# Patient Record
Sex: Male | Born: 1985 | Race: White | Hispanic: No | Marital: Married | Smoking: Never smoker
Health system: Southern US, Community
[De-identification: ages and names within clinical notes are randomized; demographics above are authoritative.]

## PROBLEM LIST (undated history)

## (undated) DIAGNOSIS — R112 Nausea with vomiting, unspecified: Secondary | ICD-10-CM

## (undated) DIAGNOSIS — R3129 Other microscopic hematuria: Secondary | ICD-10-CM

## (undated) DIAGNOSIS — T8859XA Other complications of anesthesia, initial encounter: Secondary | ICD-10-CM

## (undated) DIAGNOSIS — Z9889 Other specified postprocedural states: Secondary | ICD-10-CM

## (undated) DIAGNOSIS — T4145XA Adverse effect of unspecified anesthetic, initial encounter: Secondary | ICD-10-CM

## (undated) HISTORY — PX: WISDOM TOOTH EXTRACTION: SHX21

## (undated) HISTORY — PX: LEG SURGERY: SHX1003

---
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
## 2006-07-24 ENCOUNTER — Encounter: Admission: RE | Admit: 2006-07-24 | Discharge: 2006-07-24 | Payer: Self-pay | Admitting: Internal Medicine

## 2010-11-22 ENCOUNTER — Encounter: Payer: Self-pay | Admitting: Family Medicine

## 2010-11-22 ENCOUNTER — Ambulatory Visit
Admission: RE | Admit: 2010-11-22 | Discharge: 2010-11-22 | Payer: Self-pay | Source: Home / Self Care | Admitting: Family Medicine

## 2010-11-24 ENCOUNTER — Telehealth (INDEPENDENT_AMBULATORY_CARE_PROVIDER_SITE_OTHER): Payer: Self-pay | Admitting: *Deleted

## 2010-11-24 ENCOUNTER — Encounter (INDEPENDENT_AMBULATORY_CARE_PROVIDER_SITE_OTHER): Payer: Self-pay | Admitting: *Deleted

## 2010-12-07 NOTE — Progress Notes (Signed)
  Phone Note Outgoing Call Call back at [facility] Phone [phone]   Call placed by: Lajean Saver RN,  November 24, 2010 3:20 PM Call placed to: Patient Summary of Call: Callback. No answer. Message left with reason for call and call back with any questions or concerns

## 2010-12-07 NOTE — Letter (Signed)
Summary: Out of Work  [facility]  [address]   [place], [place] [address]   Phone: [phone]  Fax: [phone]    November 22, 2010   Employee:  Roger Marquez    To Whom It May Concern:   For Medical reasons, please excuse the above named employee from work from 11/21/10 through 11/23/10.    If you need additional information, please feel free to contact our office.         Sincerely,    Donna Christen MD

## 2010-12-07 NOTE — Assessment & Plan Note (Signed)
Summary: HEADACHE/FEVER/DIARRHEA/BODY ACHES (rm3)   Vital Signs:  Patient Profile:   25 Years Old Male CC:      N/V/D x 1 day ago, HA, dizziness Height:     67 inches Weight:      152 pounds O2 Sat:      97 % O2 treatment:    Room Air Temp:     98.4 degrees F oral Pulse rate:   87 / minute Resp:     14 per minute BP sitting:   129 / 87  (left arm) Cuff size:   regular  Vitals Entered By: Lajean Saver RN (November 22, 2010 2:38 PM)                  Updated Prior Medication List: TYLENOL 325 MG TABS (ACETAMINOPHEN) prn IMODIUM A-D 1 MG/7.5ML LIQD (LOPERAMIDE HCL)   Current Allergies: No known allergies History of Present Illness Chief Complaint: N/V/D x 1 day ago, HA, dizziness History of Present Illness:  Subjective:  Patient complains of onset of nausea, vomiting, and diarrhea two days ago at 8PM.  He has also had headache, myalgias, fatigue, and low grade fever.  The diarrhea has ceased with clear liquids but he still feels dizzy.  He had a fever this morning to 100.6.  No abdominal pain.  No resp or GU symptoms.  No recent foreign travel or drinking untreated water.  He states that he is now able to take fluids without vomiting.  REVIEW OF SYSTEMS Constitutional Symptoms      Denies fever, chills, night sweats, weight loss, weight gain, and fatigue.  Eyes       Denies change in vision, eye pain, eye discharge, glasses, contact lenses, and eye surgery. Ear/Nose/Throat/Mouth       Complains of dizziness.      Denies hearing loss/aids, change in hearing, ear pain, ear discharge, frequent runny nose, frequent nose bleeds, sinus problems, sore throat, hoarseness, and tooth pain or bleeding.  Respiratory       Denies dry cough, productive cough, wheezing, shortness of breath, asthma, bronchitis, and emphysema/COPD.  Cardiovascular       Denies murmurs, chest pain, and tires easily with exhertion.    Gastrointestinal       Complains of stomach pain, nausea/vomiting, and  diarrhea.      Denies constipation, blood in bowel movements, and indigestion. Genitourniary       Denies painful urination, kidney stones, and loss of urinary control. Neurological       Complains of headaches.      Denies paralysis, seizures, and fainting/blackouts. Musculoskeletal       Denies muscle pain, joint pain, joint stiffness, decreased range of motion, redness, swelling, muscle weakness, and gout.  Skin       Denies bruising, unusual mles/lumps or sores, and hair/skin or nail changes.  Psych       Denies mood changes, temper/anger issues, anxiety/stress, speech problems, depression, and sleep problems. Other Comments: Patient c/o N/V/D one day ago lasting 24 hours. He is able to eat very little currently. He c/o weakness, HA, nausea and dizziness. He denies eating or doing anything out of his usual. No injury   Past History:  Past Medical History: Unremarkable  Past Surgical History: right lower leg Sx  Family History: Never  Social History: Occupation: [employer] Never Smoked Alcohol use-no Drug use-no Engaged 4 step childrenSmoking Status:  never Drug Use:  no   Objective:  Appearance:  Patient appears healthy, stated age, and  in no acute distress  Eyes:  Pupils are equal, round, and reactive to light and accomdation.  Extraocular movement is intact.  Conjunctivae are not inflamed.  Ears:  cerumin impactions Nose:  No congestion or sinus tenderness Mouth:  moist mucous membranes  Pharynx:  Normal  Neck:  Supple.  No adenopathy is present.  No thyromegaly is present  Lungs:  Clear to auscultation.  Breath sounds are equal.  Heart:  Regular rate and rhythm without murmurs, rubs, or gallops.  Abdomen:  Nontender without masses or hepatosplenomegaly.  Bowel sounds somewhat increased.  No CVA or flank tenderness.  Iliopsoas and obdurator tests negative Extremities:  No edema.   Skin:  No rash Assessment New Problems: DIARRHEA (ICD-787.91) NAUSEA WITH VOMITING  (ICD-787.01)  SUSPECT VIRAL GASTROENTERITIS, IMPROVING.  Plan New Orders: New Patient Level III [99203] Planning Comments:    Wife states that she has Compazine at home; may continue this as needed.  Continue clear liquids and gradually advance diet. Follow-up with PCP if not improving.   The patient and/or caregiver has been counseled thoroughly with regard to medications prescribed including dosage, schedule, interactions, rationale for use, and possible side effects and they verbalize understanding.  Diagnoses and expected course of recovery discussed and will return if not improved as expected or if the condition worsens. Patient and/or caregiver verbalized understanding.   Orders Added: 1)  New Patient Level III [40347]

## 2010-12-07 NOTE — Letter (Signed)
Summary: Out of Work  [facility]  [address]   [place], [place] [address]   Phone: [phone]  Fax: [phone]    November 24, 2010   Employee:  AWAIS COBARRUBIAS    To Whom It May Concern:   For Medical reasons, please excuse the above named employee from work for the following dates:  November 24, 2010  If you need additional information, please feel free to contact our office.         Sincerely,    Lajean Saver RN  Appended Document: Out of Work Patient is still having some nausea and dizziness. 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

## 2011-01-30 ENCOUNTER — Encounter: Payer: Self-pay | Admitting: Family Medicine

## 2011-01-30 ENCOUNTER — Inpatient Hospital Stay (INDEPENDENT_AMBULATORY_CARE_PROVIDER_SITE_OTHER)
Admission: RE | Admit: 2011-01-30 | Discharge: 2011-01-30 | Disposition: A | Discharge status: Home / Self Care | Payer: Self-pay | Source: Ambulatory Visit | Attending: Family Medicine | Admitting: Family Medicine

## 2011-01-30 DIAGNOSIS — K4090 Unilateral inguinal hernia, without obstruction or gangrene, not specified as recurrent: Secondary | ICD-10-CM | POA: Insufficient documentation

## 2011-01-30 DIAGNOSIS — R5383 Other fatigue: Secondary | ICD-10-CM

## 2011-01-31 ENCOUNTER — Telehealth (INDEPENDENT_AMBULATORY_CARE_PROVIDER_SITE_OTHER): Payer: Self-pay | Admitting: *Deleted

## 2011-02-06 NOTE — Letter (Signed)
Summary: Internal Correspondence  Internal Correspondence   Imported By: Dannette Barbara 01/30/2011 11:30:07  _____________________________________________________________________  External Attachment:    Type:   Image     Comment:   External Document

## 2011-02-06 NOTE — Letter (Signed)
Summary: Out of Work  [facility]  [address]   [place], [place] [address]   Phone: [phone]  Fax: [phone]    January 30, 2011   Employee:  AMED DATTA    To Whom It May Concern:   Seung has a right inguinal hernia and should avoid heavy lifting, pushing, pulling, and straining.    If you need additional information, please feel free to contact our office.         Sincerely,    Donna Christen MD

## 2011-02-06 NOTE — Assessment & Plan Note (Signed)
Summary: lymph nodes swelling? rm 5   Vital Signs:  Patient Profile:   25 Years Old Male CC:      lymph nodes swollen Height:     67 inches Weight:      163.75 pounds O2 Sat:      98 % O2 treatment:    Room Air Temp:     98.4 degrees F oral Pulse rate:   70 / minute Resp:     16 per minute BP sitting:   132 / 77  (left arm) Cuff size:   regular  Vitals Entered By: Clemens Catholic LPN (January 30, 2011 11:14 AM)                  Updated Prior Medication List: No Medications Current Allergies (reviewed today): No known allergies History of Present Illness Chief Complaint: lymph nodes swollen History of Present Illness:  Subjective:  Patient presents with his wife.  They report that he developed URI symptoms about 3 to 4 weeks ago that started with fatigue and fever for two days, associated with a sore throat for about a week.  He developed a cough that has lasted about 3 weeks, now finally resolving.  He states that his energy is now almost back to normal.  His wife has noted that he has had swollen lymph nodes in his neck, and she believes swollen nodes in right inguinal area also.  No GI or GU symptoms.  No abdominal pain.  Good appetite.  No rash. He also complains that his ears feel clogged without pain  REVIEW OF SYSTEMS Constitutional Symptoms      Denies fever, chills, night sweats, weight loss, weight gain, and fatigue.  Eyes       Denies change in vision, eye pain, eye discharge, glasses, contact lenses, and eye surgery. Ear/Nose/Throat/Mouth       Denies hearing loss/aids, change in hearing, ear pain, ear discharge, dizziness, frequent runny nose, frequent nose bleeds, sinus problems, sore throat, hoarseness, and tooth pain or bleeding.  Respiratory       Denies dry cough, productive cough, wheezing, shortness of breath, asthma, bronchitis, and emphysema/COPD.  Cardiovascular       Denies murmurs, chest pain, and tires easily with exhertion.    Gastrointestinal       Denies stomach pain, nausea/vomiting, diarrhea, constipation, blood in bowel movements, and indigestion. Genitourniary       Denies painful urination, kidney stones, and loss of urinary control. Neurological       Denies paralysis, seizures, and fainting/blackouts. Musculoskeletal       Denies muscle pain, joint pain, joint stiffness, decreased range of motion, redness, swelling, muscle weakness, and gout.  Skin       Denies bruising, unusual mles/lumps or sores, and hair/skin or nail changes.  Psych       Denies mood changes, temper/anger issues, anxiety/stress, speech problems, depression, and sleep problems. Other Comments: pt c/o his lymph nodes in his neck being swollen x sunday. he also states that his lymph nodes in his RT groin area being very swollen x 2wks. no fever. he has had a chronic cough. he took 2 Advil Sunday.   Past History:  Past Medical History: Reviewed history from 11/22/2010 and no changes required. Unremarkable  Past Surgical History: Reviewed history from 11/22/2010 and no changes required. right lower leg Sx  Family History: father-  Family History Diabetes 1st degree relative Family History Hypertension mom-healthy  Social History: Reviewed history from 11/22/2010 and  no changes required. Occupation: [employer] Never Smoked Alcohol use-no Drug use-no Engaged 4 step children   Appearance:  Patient appears healthy, stated age, and in no acute distress  Eyes:  Pupils are equal, round, and reactive to light and accomdation.  Extraocular movement is intact.  Conjunctivae are not inflamed.  Ears:  Canals occluded with cerumen.  Post lavage, canals and tympanic membranes appear normal Nose:  Not congested, no sinus tenderness Pharynx:  Normal  Neck:  Supple.  Slightly tender shotty anterior/posterior nodes are palpated bilaterally.  No thyromegaly Lungs:  Clear to auscultation.  Breath sounds are equal.  Heart:  Regular rate and rhythm without  murmurs, rubs, or gallops.  Abdomen:  Mild tenderness over spleen without masses or hepatosplenomegaly.  Bowel sounds are present.  No CVA or flank tenderness.  GU:  Normal penis/testes/scrotum.  No adenopathy.  Right inguinal hernia is present with valsalva. Extremities:  No edema.  No epitrochlear nodes palpated Skin:  No rash. CBC:  WBC 7.1; Normal diff; Hgb 15.0  Assessment New Problems: CERUMEN IMPACTION, BILATERAL (ICD-380.4) INGUINAL HERNIA, RIGHT (ICD-550.90) FATIGUE (ICD-780.79) FAMILY HISTORY DIABETES 1ST DEGREE RELATIVE (ICD-V18.0)  SUSPECT FATIGUE A RESULT OF RESOLVING MONO.  NORMAL CBC TODAY. RIGHT INGUINAL HERNIA  Plan New Orders: Surgical Referral [Surgery] CBC w/Diff [[id number]] Est. Patient Level II [09811] Planning Comments:   Reassurance.  Follow-up with PCP if develops increased lymph node swelling. Call if any ear discomfort or discharge develops after ear lavage (will call in Rx for Cortisporin Otic susp) Refer to general surgeon for hernia repair.  Given a Mickel Crow patient information and instruction sheet on topic inguinal hernia.   The patient and/or caregiver has been counseled thoroughly with regard to medications prescribed including dosage, schedule, interactions, rationale for use, and possible side effects and they verbalize understanding.  Diagnoses and expected course of recovery discussed and will return if not improved as expected or if the condition worsens. Patient and/or caregiver verbalized understanding.   Orders Added: 1)  Surgical Referral [Surgery] 2)  CBC w/Diff [[id number]] 3)  Est. Patient Level II [13086]

## 2011-02-06 NOTE — Progress Notes (Signed)
  Phone Note Outgoing Call   Call placed by: Clemens Catholic LPN,  January 31, 2011 8:51 AM Call placed to: Patient Summary of Call: appt sch for pt with [facility] in the [facility] office for 02/06/11 @ 1:30pm. pt aware of financial agreement with them and to arrive at 1:00pm to do paperwork. Initial call taken by: Clemens Catholic LPN,  January 31, 2011 8:53 AM

## 2011-03-23 ENCOUNTER — Encounter (HOSPITAL_BASED_OUTPATIENT_CLINIC_OR_DEPARTMENT_OTHER)
Admission: RE | Admit: 2011-03-23 | Discharge: 2011-03-23 | Disposition: A | Discharge status: Home / Self Care | Payer: Worker's Compensation | Source: Ambulatory Visit | Attending: Surgery | Admitting: Surgery

## 2011-03-23 LAB — COMPREHENSIVE METABOLIC PANEL
ALT: 102 U/L — ABNORMAL HIGH (ref 0–53)
AST: 43 U/L — ABNORMAL HIGH (ref 0–37)
Albumin: 4 g/dL (ref 3.5–5.2)
Calcium: 9.5 mg/dL (ref 8.4–10.5)
Creatinine, Ser: 0.93 mg/dL (ref 0.4–1.5)
Sodium: 139 mEq/L (ref 135–145)
Total Protein: 7.3 g/dL (ref 6.0–8.3)

## 2011-03-23 LAB — CBC
MCH: 30 pg (ref 26.0–34.0)
MCHC: 36.2 g/dL — ABNORMAL HIGH (ref 30.0–36.0)
Platelets: 231 10*3/uL (ref 150–400)
RBC: 5.26 MIL/uL (ref 4.22–5.81)
RDW: 13.1 % (ref 11.5–15.5)

## 2011-03-23 LAB — DIFFERENTIAL
Basophils Relative: 0 % (ref 0–1)
Eosinophils Absolute: 0.2 10*3/uL (ref 0.0–0.7)
Eosinophils Relative: 3 % (ref 0–5)
Monocytes Absolute: 0.6 10*3/uL (ref 0.1–1.0)
Monocytes Relative: 9 % (ref 3–12)
Neutrophils Relative %: 52 % (ref 43–77)

## 2011-03-23 NOTE — Op Note (Signed)
NAME:  Roger Marquez, Roger Marquez                         ACCOUNT NO.:  [id number]   MEDICAL RECORD NO.:  [id number]                   PATIENT TYPE:  AMB   LOCATION:  DSC                                  FACILITY:  [facility]   PHYSICIAN:  Harvie Junior, M.D.                DATE OF BIRTH:  [birth year]   DATE OF PROCEDURE:  04/17/2004  DATE OF DISCHARGE:                                 OPERATIVE REPORT   PREOPERATIVE DIAGNOSIS:  Fascial hernia, left lower extremity.   POSTOPERATIVE DIAGNOSIS:  Fascial hernia, left lower extremity.   PROCEDURE:  Anterior and lateral compartment fasciotomy, left lower  extremity.   SURGEON:  Harvie Junior, M.D.   ASSISTANT:  Marshia Ly, P.A.   ANESTHESIA:  General.   BRIEF HISTORY:  The patient is an 25 year old male status post having had a  left lower extremity fascial release for fascial hernia.  He has done  wonderfully.  He began having similar symptoms of his left leg with  activities and because of continued complaints of pain, it was ultimately  felt that he wanted to have this similar procedure done.  He is brought to  the operating room for this procedure.   DESCRIPTION OF PROCEDURE:  The patient was brought to the operating room and  after adequate anesthesia was obtained with a general anesthetic, the  patient was placed on the operating table.  The left leg was prepped and  draped in the usual sterile fashion.  Following this, the leg was  exsanguinated and tourniquet inflated to 350 mmHg.  A small incision was  made over the area of the fascial defect and the fascia was identified from  the tibia all the way back to the fibula.  The fascia was opened and the  anterior compartment dissected superiorly to the level of the knee and  distally to the level of the ankle.  Prior to doing this, the attempt to  finding the defect at both levels identified was unsuccessful and I think  this was a weakness in the fascia and not a true fascial defect.  At  this  point, attention was turned back to the lateral compartment where a lateral  release of the lateral compartment was done proximally and distally in a  similar fashion.  At this point, the wound was copiously irrigated and  suctioned dry.  The skin was closed with a combination of 2-0 Vicryl and a 3-  0 Monocryl pull out suture.  Benzoin and Steri-Strips were applied.  A  sterile compression dressing was applied.  The patient was taken to the  recovery room where he was noted to be in satisfactory condition.  Estimated  blood loss for the procedure was none.  Harvie Junior, M.D.    Ranae Plumber  D:  04/17/2004  T:  04/17/2004  Job:  [id number]

## 2011-03-23 NOTE — Op Note (Signed)
   NAME:  Roger Marquez, Roger Marquez                         ACCOUNT NO.:  [id number]   MEDICAL RECORD NO.:  [id number]                   PATIENT TYPE:  AMB   LOCATION:  DSC                                  FACILITY:  [facility]   PHYSICIAN:  Harvie Junior, M.D.                DATE OF BIRTH:  Feb 02, 1986   DATE OF PROCEDURE:  02/15/2003  DATE OF DISCHARGE:  02/15/2003                                 OPERATIVE REPORT   PREOPERATIVE DIAGNOSIS:  Painful fascial hernia, right lower leg.   POSTOPERATIVE DIAGNOSIS:  Painful fascial hernia, right lower leg.   PROCEDURE:  Fasciotomy right anterior compartment of the right leg and  lateral compartment of the right leg.   SURGEON:  Harvie Junior, M.D.   ASSISTANT:  Marshia Ly, P.A.   ANESTHESIA:  General.   BRIEF HISTORY:  The patient is a 25 year old male with a long history of  having a small fascial hernia of the right leg.  He ultimately would have  pain with exertion.  Because of continued complaints of pain and palpation  of the fascial hernia, the patient was taken to the operating room for  opening of the fascial hernia.   DESCRIPTION OF PROCEDURE:  The patient was taken to the operating room and  after undergoing general anesthesia, the patient was placed on the operating  table, the right leg was prepped and draped in the usual sterile fashion.  Following this, a small incision was made over the right hernia.  Three  small areas of fascial herniation were identified and connected and  following this, the anterior and lateral fascial compartments were opened  with long Metzenbaum scissors throughout the length of the lower extremity  with care being taken to isolate the fascia from both above the skin and  below from the muscle tissue.  Once the area had been opened, the wound was  copiously irrigated and suctioned dry.  The wound was then closed with 4-0  nylon interrupted sutures.  A sterile, compressive dressing was applied.  The  patient was taken to the recovery room and was noted to be in  satisfactory condition.                                               Harvie Junior, M.D.    Ranae Plumber  D:  06/02/2003  T:  06/02/2003  Job:  [id number]

## 2011-03-27 ENCOUNTER — Ambulatory Visit (HOSPITAL_BASED_OUTPATIENT_CLINIC_OR_DEPARTMENT_OTHER)
Admission: RE | Admit: 2011-03-27 | Discharge: 2011-03-27 | Disposition: A | Discharge status: Home / Self Care | Payer: Worker's Compensation | Source: Ambulatory Visit | Attending: Surgery | Admitting: Surgery

## 2011-03-27 DIAGNOSIS — Z01812 Encounter for preprocedural laboratory examination: Secondary | ICD-10-CM | POA: Insufficient documentation

## 2011-03-27 DIAGNOSIS — K4090 Unilateral inguinal hernia, without obstruction or gangrene, not specified as recurrent: Secondary | ICD-10-CM | POA: Insufficient documentation

## 2011-03-27 HISTORY — PX: HERNIA REPAIR: SHX51

## 2011-04-04 NOTE — Op Note (Signed)
NAMEALDRIDGE, KRZYZANOWSKI               ACCOUNT NO.:  [id number]  MEDICAL RECORD NO.:  [id number]           PATIENT TYPE:  LOCATION:                                 FACILITY:  PHYSICIAN:  Clovis Pu. Bernetha Anschutz, M.D.     DATE OF BIRTH:  DATE OF PROCEDURE:  03/27/2011 DATE OF DISCHARGE:                              OPERATIVE REPORT   PREOPERATIVE DIAGNOSIS:  Right inguinal hernia.  POSTOPERATIVE DIAGNOSIS:  Direct right inguinal hernia  PROCEDURE:  Repair of right hernia with Ultrapro hernia system mesh.  SURGEON:  Maisie Fus A. Mcgwire Dasaro, MD  ANESTHESIA:  General LMA with 0.25% Sensorcaine local with epinephrine 20 mL used.  ESTIMATED BLOOD LOSS:  Minimal.  SPECIMENS:  None.  INDICATIONS FOR PROCEDURE:  The patient is a 25 year old male with a bulging painful right inguinal hernia.  He was evaluated and recommendations were made.  Open laparoscopic repairs were discussed as well as potential observation.  He also had a skin lesion in his right groin that want me to be removed as well.  It appeared to be an actinic keratoses but that was not approved by insurance.  Therefore today we decided not to do that.  I discussed this with the patient and his family preoperatively the fact that skin lesion was not going to be removed and they were in agreement not to do so.  We discussed the procedure of open inguinal hernia repair with mesh, the risk of bleeding, infection, hernia recurrence, numbness, discomfort, chronic pain issues after hernia surgery, mesh infection, injury to cord structures, injury to major vascular structures, injury to bladder, ureter, small bowel and large bowel.  Also discussed numbness around the incision, also in the inner aspect of the thigh and scrotum.  We discussed preoperatively a laparoscopic repair.  After discussion of all the above and also observation and potential treatment, we agreed to preserve it.  He wished to do an open repair.  DESCRIPTION OF  PROCEDURE:  The patient was brought to the operating room after being seen in the holding area with a right-sided marked.  After induction of LMA anesthesia, time-out was done after prep and drape of the right inguinal region.  Sensorcaine 0.25% was infiltrated in the right inguinal crease.  Dissection was carried down through the subcutaneous fat tissue.  The superficial epigastric vessels were ligated with 3-0 Vicryl.  The aponeurosis of the external oblique was visualized, injected with 0.25% Sensorcaine with epinephrine.  Fibers were opened in the direction of another fibers to the external ring. Cord structures were encircled with 0.25 inch Penrose drain.  He had a very large direct hernia that I dissected off the cord structures. There was no evidence of indirect hernia.  Ilioinguinal nerve coursed with the cord structures.  I then used a large Utrapro Prolene hernia system and I was able to place inner leaflet in the preperitoneal space. Once we did this, I then cut a slit of the cord structures and closed the mesh around the cord structures with 2-0 Novafil.  A #1-0 Vicryl was used to tack the onlay down to the pubic tubercle and [employer]  ligament region and a second 0 Vicryl suture was used to connect the connection between the onlay and the underlay with the internal oblique.  The onlay was secured, shelving edge of the inguinal ligament and the internal oblique with #1 Novafil pop-off circumferentially.  This left ample room for the cord structures to exit through the mesh with enough space for the tip of my fifth digit to be inserted into it.  At this point in time, I preserved the ilioinguinal nerve which ran along the cord structure itself.  Iliohypogastric nerve was well out of the operative field.  The general femoral nerve was preserved as well.  We then at this point in time closed the aponeurosis of external oblique with 2-0 Vicryl.  A 3- 0 Vicryl was used to approximate  the Scarpa fascia and 4-0 Monocryl was used to close the skin in a subcuticular fashion.  Dermabond was applied.  All final counts of sponge, needle and instrument counts were found to be correct at this portion of the case.  The patient was awakened, extubated, and taken to recovery in satisfactory condition.     Alaze Garverick A. Holden Maniscalco, M.D.     TAC/MEDQ  D:  03/27/2011  T:  03/27/2011  Job:  [id number]  cc:   Reola Calkins  Electronically Signed by Harriette Bouillon M.D. on 04/04/2011 07:32:39 AM

## 2011-05-07 ENCOUNTER — Telehealth (INDEPENDENT_AMBULATORY_CARE_PROVIDER_SITE_OTHER): Payer: Self-pay | Admitting: General Surgery

## 2011-06-14 ENCOUNTER — Encounter (INDEPENDENT_AMBULATORY_CARE_PROVIDER_SITE_OTHER): Payer: Self-pay | Admitting: Surgery

## 2011-06-14 ENCOUNTER — Ambulatory Visit (INDEPENDENT_AMBULATORY_CARE_PROVIDER_SITE_OTHER): Payer: Worker's Compensation | Admitting: Surgery

## 2011-06-14 DIAGNOSIS — G8918 Other acute postprocedural pain: Secondary | ICD-10-CM

## 2011-06-14 MED ORDER — TRAMADOL HCL 50 MG PO TABS: 50.0000 mg | ORAL_TABLET | Freq: Four times a day (QID) | ORAL | Status: DC | PRN

## 2011-06-14 MED ORDER — OXYCODONE-ACETAMINOPHEN 5-325 MG PO TABS: 1.0000 | ORAL_TABLET | Freq: Four times a day (QID) | ORAL | Status: DC | PRN

## 2011-06-14 NOTE — Progress Notes (Signed)
The patient returns to clinic today. He is about 10 weeks out from repair of right inguinal hernia with ultrapro mesh.  He was doing a lot of squatting over the weekend and developed right groin pain at his operative site. The pain as throbbing in nature and made worse with squatting. The area now is sore to palpation. There is no new bulge.  Physical examination: Right inguinal incision well healed. No evidence of recurrent hernia. There is no redness. Mild tenderness to palpation of the scar.  Impression right groin pain after inguinal hernia repair  Plan: I will place him on old TRAM milligrams p.o. every 6 hours as needed for pain and Roxicet 1-2 to Q6 hours p.r.n. pain. I've asked him to refrain from heavy lifting squatting until I see him back in about 3 weeks. If he continues to have discomfort, I will arrange for an MRI to further evaluate.

## 2011-06-14 NOTE — Patient Instructions (Signed)
Follow up in 2-3 weeks.  Take the ultram during the day as directed and the roxicet only at night.  Do not take with alcoholic beverages and do not drive if taking roxicet.  Refrain from squatting or heavy lifting until follow up.

## 2011-06-21 ENCOUNTER — Telehealth (INDEPENDENT_AMBULATORY_CARE_PROVIDER_SITE_OTHER): Payer: Self-pay

## 2011-06-21 NOTE — Telephone Encounter (Signed)
Pt called and stated the Ultram is making him nauseated.  He has been on it since surgery and was given a script on 8-9 along with Roxicet.  He was requesting something different.  I advised him to stop the Ultram and take Advil or Ibuprofen,  alternating it between the Roxicet.  I asked him to call in the am if he still feels sick.

## 2011-07-12 ENCOUNTER — Encounter (INDEPENDENT_AMBULATORY_CARE_PROVIDER_SITE_OTHER): Payer: Worker's Compensation | Admitting: Surgery

## 2011-09-03 ENCOUNTER — Encounter (INDEPENDENT_AMBULATORY_CARE_PROVIDER_SITE_OTHER): Payer: Worker's Compensation | Admitting: Surgery

## 2011-09-11 ENCOUNTER — Telehealth (INDEPENDENT_AMBULATORY_CARE_PROVIDER_SITE_OTHER): Payer: Self-pay | Admitting: Surgery

## 2011-09-14 ENCOUNTER — Telehealth (INDEPENDENT_AMBULATORY_CARE_PROVIDER_SITE_OTHER): Payer: Self-pay | Admitting: General Surgery

## 2011-09-14 NOTE — Telephone Encounter (Signed)
PT R/S TO SEE DR. CORNETT ON 09-17-11.

## 2011-09-14 NOTE — Telephone Encounter (Signed)
Needs appt

## 2011-09-14 NOTE — Telephone Encounter (Signed)
Pt is having intermittent pain in the right groin are, he is s/p inguinal hernia repair. Pt states he feels no bulges but describes the pain being like it was prior to his surgery. Pt needs advise.

## 2011-09-14 NOTE — Telephone Encounter (Signed)
PT SCHEDULED TO SEE DR. CORNETT ON Monday 09-17-11.

## 2011-09-17 ENCOUNTER — Ambulatory Visit (INDEPENDENT_AMBULATORY_CARE_PROVIDER_SITE_OTHER): Payer: Worker's Compensation | Admitting: Surgery

## 2011-09-17 ENCOUNTER — Encounter (INDEPENDENT_AMBULATORY_CARE_PROVIDER_SITE_OTHER): Payer: Self-pay | Admitting: Surgery

## 2011-09-17 VITALS — BP 136/90 | HR 84 | Temp 99.0°F | Resp 16 | Ht 67.0 in | Wt 175.6 lb

## 2011-09-17 DIAGNOSIS — R109 Unspecified abdominal pain: Secondary | ICD-10-CM

## 2011-09-17 DIAGNOSIS — R1030 Lower abdominal pain, unspecified: Secondary | ICD-10-CM

## 2011-09-17 MED ORDER — OXYCODONE-ACETAMINOPHEN 5-325 MG PO TABS: 1.0000 | ORAL_TABLET | Freq: Four times a day (QID) | ORAL | Status: DC | PRN

## 2011-09-17 NOTE — Progress Notes (Signed)
The patient returns to clinic today. He is about 6 months out from repair of right inguinal hernia with ultrapro mesh.  He continues to have pain in right groin.  It is a constant ache.  It is made worse with lifting.  Advil helps but the pain is worsening.   The area now is sore to palpation. There is no new bulge. Review of Systems - Negative except right groin pain Physical examination: Right inguinal incision well healed. No evidence of recurrent hernia. There is no redness. Mild tenderness to palpation of the scar.  Impression right groin pain after inguinal hernia repair  Plan:He has failed medical management.  Will send for MRI to evaluate for breakdown of the repair.  Given a script for percocet #20.  Follow up after.  No lifting greater than 20 lbs.

## 2011-09-17 NOTE — Patient Instructions (Signed)
No lifting greater than 20 lbs.  You will be scheduled for a MRI to further evaluate right groin pain.  Continue advil as needed.

## 2011-09-21 ENCOUNTER — Ambulatory Visit
Admission: RE | Admit: 2011-09-21 | Discharge: 2011-09-21 | Disposition: A | Discharge status: Home / Self Care | Payer: Worker's Compensation | Source: Ambulatory Visit | Attending: Surgery | Admitting: Surgery

## 2011-09-21 DIAGNOSIS — R1030 Lower abdominal pain, unspecified: Secondary | ICD-10-CM

## 2011-09-24 NOTE — Telephone Encounter (Signed)
Pt has been eval.

## 2011-10-08 ENCOUNTER — Encounter (INDEPENDENT_AMBULATORY_CARE_PROVIDER_SITE_OTHER): Payer: Self-pay | Admitting: Surgery

## 2011-10-08 ENCOUNTER — Ambulatory Visit (INDEPENDENT_AMBULATORY_CARE_PROVIDER_SITE_OTHER): Payer: Worker's Compensation | Admitting: Surgery

## 2011-10-08 VITALS — BP 142/98 | HR 76 | Temp 99.0°F | Resp 16 | Ht 67.0 in | Wt 175.4 lb

## 2011-10-08 DIAGNOSIS — R1030 Lower abdominal pain, unspecified: Secondary | ICD-10-CM

## 2011-10-08 DIAGNOSIS — R109 Unspecified abdominal pain: Secondary | ICD-10-CM

## 2011-10-08 MED ORDER — OXYCODONE-ACETAMINOPHEN 5-325 MG PO TABS: 1.0000 | ORAL_TABLET | Freq: Four times a day (QID) | ORAL | Status: DC | PRN

## 2011-10-08 MED ORDER — IBUPROFEN 200 MG PO TABS: 800.0000 mg | ORAL_TABLET | Freq: Four times a day (QID) | ORAL | Status: DC | PRN

## 2011-10-08 NOTE — Progress Notes (Signed)
Patient ID: Roger Marquez, male   DOB: 08-22-1986, 25 y.o.   MRN: [id number]  Chief Complaint  Patient presents with  . Routine Post Op    RIH    HPI Roger Marquez is a 25 y.o. male. The patient returns today. His right groin pain is the same. MRI showed laxity of the repair of the right possible medial recurrence of his right inguinal hernia. HPI  Past Medical History  Diagnosis Date  . Inguinal hernia     right    Past Surgical History  Procedure Date  . Hernia repair     right  . Leg surgery     bilateral  . Wisdom tooth extraction     Family History  Problem Relation Age of Onset  . Diabetes Father   . Hypertension Father     Social History History  Substance Use Topics  . Smoking status: Never Smoker   . Smokeless tobacco: Not on file  . Alcohol Use: Yes    No Known Allergies  Current Outpatient Prescriptions  Medication Sig Dispense Refill  . oxyCODONE-acetaminophen (ROXICET) 5-325 MG per tablet Take 1 tablet by mouth every 6 (six) hours as needed for pain.  20 tablet  0    Review of Systems Review of Systems  Constitutional: Negative.   HENT: Negative.   Eyes: Negative.   Respiratory: Negative.   Cardiovascular: Negative.   Gastrointestinal: Negative.   Genitourinary: Negative.   Musculoskeletal: Negative.   Neurological: Negative.   Hematological: Negative.   Psychiatric/Behavioral: Negative.     Blood pressure 142/98, pulse 76, temperature 99 F (37.2 C), temperature source Temporal, resp. rate 16, height 5\' 7"  (1.702 m), weight 175 lb 6.4 oz (79.561 kg).  Physical Exam Physical Exam  Constitutional: He is oriented to person, place, and time. He appears well-developed and well-nourished.  HENT:  Head: Normocephalic and atraumatic.  Eyes: EOM are normal. Pupils are equal, round, and reactive to light.  Genitourinary:       Right groin tenderness  Musculoskeletal: Normal range of motion.  Neurological: He is alert and oriented to  person, place, and time.  Skin: Skin is warm and dry.  Psychiatric: He has a normal mood and affect. His behavior is normal. Judgment and thought content normal.    Data Reviewed MRI right groin laxity with possible small RIH recurrence.  Assessment    Right inguinodynia with possible recurrence per MRI    Plan      Recommend diagnostic laparoscopy with possible laparoscopic revision of right inguinal hernia.The risk of hernia repair include bleeding,  Infection,   Recurrence of the hernia,  Mesh use, chronic pain,  Organ injury,  Bowel injury,  Bladder injury,   nerve injury with numbness around the incision,  Death,  and worsening of preexisting  medical problems.  The alternatives to surgery have been discussed as well..  Long term expectations of both operative and non operative treatments have been discussed.   The patient agrees to proceed.       Roger Marquez A. 10/08/2011, 11:35 AM

## 2011-10-08 NOTE — Patient Instructions (Signed)
You will be scheduled for laparoscopy to evaluate the groin pain and possible repair of right inguinal hernia.

## 2011-10-12 ENCOUNTER — Other Ambulatory Visit (INDEPENDENT_AMBULATORY_CARE_PROVIDER_SITE_OTHER): Payer: Self-pay

## 2011-10-12 DIAGNOSIS — Z20818 Contact with and (suspected) exposure to other bacterial communicable diseases: Secondary | ICD-10-CM

## 2011-10-12 MED ORDER — AZITHROMYCIN 1 G PO PACK: 1.0000 | PACK | Freq: Once | ORAL | Status: AC

## 2011-12-27 ENCOUNTER — Ambulatory Visit (INDEPENDENT_AMBULATORY_CARE_PROVIDER_SITE_OTHER): Payer: Worker's Compensation | Admitting: Surgery

## 2011-12-27 ENCOUNTER — Encounter (INDEPENDENT_AMBULATORY_CARE_PROVIDER_SITE_OTHER): Payer: Self-pay | Admitting: Surgery

## 2011-12-27 VITALS — BP 142/102 | HR 84 | Temp 99.2°F | Resp 16 | Ht 67.0 in | Wt 179.6 lb

## 2011-12-27 DIAGNOSIS — R109 Unspecified abdominal pain: Secondary | ICD-10-CM

## 2011-12-27 DIAGNOSIS — R1030 Lower abdominal pain, unspecified: Secondary | ICD-10-CM

## 2011-12-27 NOTE — Patient Instructions (Signed)
Let me know if you need any other letters. Can arrange other referrals if desired.

## 2011-12-27 NOTE — Progress Notes (Signed)
The patient returns to clinic. He continues to have a right groin pain. Last time I saw him I recommended a laparoscopy with possible revision of his right inguinal hernia. He is caught up in some [employer]. issues. Overall, he is about the same.  Exam: Right inguinal scar noted. No obvious hernia on exam but tenderness over the anal canal and laxity. Left anal canal normal.  Impression: Right inguinodynia with questionable right inguinal hernia recurrence.    Plan: Await Workmen's Comp. May need second opinion. The right letter since they are using an attorney to help work through his workers comp issues.

## 2012-01-28 ENCOUNTER — Other Ambulatory Visit (INDEPENDENT_AMBULATORY_CARE_PROVIDER_SITE_OTHER): Payer: Self-pay

## 2012-01-28 DIAGNOSIS — R1030 Lower abdominal pain, unspecified: Secondary | ICD-10-CM

## 2012-01-28 MED ORDER — HYDROCODONE-ACETAMINOPHEN 5-325 MG PO TABS: 1.0000 | ORAL_TABLET | Freq: Four times a day (QID) | ORAL | Status: AC | PRN

## 2012-04-14 ENCOUNTER — Other Ambulatory Visit (INDEPENDENT_AMBULATORY_CARE_PROVIDER_SITE_OTHER): Payer: Self-pay

## 2012-04-14 DIAGNOSIS — G8918 Other acute postprocedural pain: Secondary | ICD-10-CM

## 2012-04-14 MED ORDER — IBUPROFEN 800 MG PO TABS: 800.0000 mg | ORAL_TABLET | Freq: Three times a day (TID) | ORAL | Status: DC | PRN

## 2012-05-19 ENCOUNTER — Telehealth (INDEPENDENT_AMBULATORY_CARE_PROVIDER_SITE_OTHER): Payer: Self-pay

## 2012-05-19 NOTE — Telephone Encounter (Signed)
Returned patients wife's call. Told her they will need follow up appointment with Cornett before we can schedule surgery. She asked how much the visit will be since they are self pay now. I told her I don't handle finances here and to call the financial department. She said financial department told her to call me to find out. Again I told her I do clinical only and try again with financial department tomorrow. I told her to call me back Wednesday if she has not got any information on the financial questions and I would have my manager look into it to help them out.

## 2012-06-12 ENCOUNTER — Encounter (INDEPENDENT_AMBULATORY_CARE_PROVIDER_SITE_OTHER): Payer: Self-pay | Admitting: Surgery

## 2012-06-12 ENCOUNTER — Ambulatory Visit (INDEPENDENT_AMBULATORY_CARE_PROVIDER_SITE_OTHER): Payer: Worker's Compensation | Admitting: Surgery

## 2012-06-12 VITALS — BP 126/86 | HR 84 | Temp 98.0°F | Resp 18 | Ht 67.0 in | Wt 175.6 lb

## 2012-06-12 DIAGNOSIS — R109 Unspecified abdominal pain: Secondary | ICD-10-CM

## 2012-06-12 DIAGNOSIS — R1030 Lower abdominal pain, unspecified: Secondary | ICD-10-CM

## 2012-06-12 MED ORDER — HYDROCODONE-ACETAMINOPHEN 5-325 MG PO TABS: 1.0000 | ORAL_TABLET | Freq: Four times a day (QID) | ORAL | Status: DC | PRN

## 2012-06-12 NOTE — Progress Notes (Signed)
Patient ID: Roger Marquez, male   DOB: 06-18-1986, 26 y.o.   MRN: [id number]  No chief complaint on file.   HPI Roger Marquez is a 26 y.o. male.  The patient returns in followup of his right groin pain. He is cleared all of his workman compensation issues and is now cleared to pursue treatment of his right groin pain. He thinks the pain is a little bit worse than it was before. He states the pain is made worse with exertion. He has some mild left groin discomfort. No nausea or vomiting. HPI  Past Medical History  Diagnosis Date  . Inguinal hernia     right    Past Surgical History  Procedure Date  . Leg surgery     bilateral  . Wisdom tooth extraction   . Hernia repair 03/27/11    right    Family History  Problem Relation Age of Onset  . Diabetes Father   . Hypertension Father     Social History History  Substance Use Topics  . Smoking status: Never Smoker   . Smokeless tobacco: Not on file  . Alcohol Use: Yes    No Known Allergies  Current Outpatient Prescriptions  Medication Sig Dispense Refill  . HYDROcodone-acetaminophen (NORCO/VICODIN) 5-325 MG per tablet Take 1 tablet by mouth every 6 (six) hours as needed.  30 tablet  0  . ibuprofen (ADVIL,MOTRIN) 800 MG tablet Take 1 tablet (800 mg total) by mouth every 8 (eight) hours as needed.  30 tablet  0    Review of Systems Review of Systems  Constitutional: Negative for fever, chills and unexpected weight change.  HENT: Negative for hearing loss, congestion, sore throat, trouble swallowing and voice change.   Eyes: Negative for visual disturbance.  Respiratory: Negative for cough and wheezing.   Cardiovascular: Negative for chest pain, palpitations and leg swelling.  Gastrointestinal: Negative for nausea, vomiting, abdominal pain, diarrhea, constipation, blood in stool, abdominal distention, anal bleeding and rectal pain.  Genitourinary: Negative for hematuria and difficulty urinating.  Musculoskeletal: Negative  for arthralgias.  Skin: Negative for rash and wound.  Neurological: Negative for seizures, syncope, weakness and headaches.  Hematological: Negative for adenopathy. Does not bruise/bleed easily.  Psychiatric/Behavioral: Negative for confusion.    Blood pressure 126/86, pulse 84, temperature 98 F (36.7 C), resp. rate 18, height 5\' 7"  (1.702 m), weight 175 lb 9.6 oz (79.652 kg).  Physical Exam Physical Exam  Constitutional: He is oriented to person, place, and time. He appears well-developed and well-nourished.  HENT:  Head: Normocephalic and atraumatic.  Eyes: EOM are normal. Pupils are equal, round, and reactive to light.  Neck: Normal range of motion. Neck supple.  Cardiovascular: Normal rate and regular rhythm.   Pulmonary/Chest: Effort normal and breath sounds normal.  Abdominal: Soft. Bowel sounds are normal.    Musculoskeletal: Normal range of motion.  Neurological: He is alert and oriented to person, place, and time.  Skin: Skin is warm and dry.  Psychiatric: He has a normal mood and affect. His behavior is normal. Judgment and thought content normal.    Data Reviewed MRI 11/12  Possible recurrence of right internal hernia. No evidence of left inguinal hernia.  Assessment    Inguinodynia right groin one year out from open repair of right inguinal hernia with mesh without obvious hernia on exam    Plan    Lengthy discussion about options. Both operative and nonoperative options discussed. Nonoperative options would include pain management and medicines and/or  injections to achieve that goal. Operative intervention would include laparoscopy to evaluate and treat what  was encountered which could be recurrent hernia, redundant mesh, nerve entrapment, organ injury, blood vessel injury, nerve injury, or other abnormality. The right inguinal canal has been symptomatic. He does have some mild left groin pain. No obvious hernia on exam. Risk of surgery include but are not  exclusive of bleeding, infection, organ injury, bowel injury, bladder injury, injury to the ureter, injury to the intestine, injury to the colon, nerve injury, chronic pain, internal bleeding, blood clots, death, and the need for further surgical procedures. This could be a permanent disability and is no guarantee to relieve his pain. He understands the above and would  like to pursue laparoscopy which I feels appropriate.       Othelia Riederer A. 06/12/2012, 11:29 AM

## 2012-06-12 NOTE — Patient Instructions (Signed)
Hernia Repair with Laparoscope A hernia occurs when an internal organ pushes out through a weak spot in the belly (abdominal) wall muscles. Hernias most commonly occur in the groin and around the navel. Hernias can also occur through a cut by the surgeon (incision) after an abdominal operation. A hernia may be caused by:  Lifting heavy objects.   Prolonged coughing.   Straining to move your bowels.  Hernias can often be pushed back into place (reduced). Most hernias tend to get worse over time. Problems occur when abdominal contents get stuck in the opening and the blood supply is blocked or impaired (incarcerated hernia). Because of these risks, you require surgery to repair the hernia. Your hernia will be repaired using a laparoscope. Laparoscopic surgery is a type of minimally invasive surgery. It does not involve making a typical surgical cut (incision) in the skin. A laparoscope is a telescope-like rod and lens system. It is usually connected to a video camera and a light source so your caregiver can clearly see the operative area. The instruments are inserted through  to  inch (5 mm or 10 mm) openings in the skin at specific locations. A working and viewing space is created by blowing a small amount of carbon dioxide gas into the abdominal cavity. The abdomen is essentially blown up like a balloon (insufflated). This elevates the abdominal wall above the internal organs like a dome. The carbon dioxide gas is common to the human body and can be absorbed by tissue and removed by the respiratory system. Once the repair is completed, the small incisions will be closed with either stitches (sutures) or staples (just like a paper stapler only this staple holds the skin together). LET YOUR CAREGIVERS KNOW ABOUT:  Allergies.   Medications taken including herbs, eye drops, over the counter medications, and creams.   Use of steroids (by mouth or creams).   Previous problems with anesthetics or  Novocaine.   Possibility of pregnancy, if this applies.   History of blood clots (thrombophlebitis).   History of bleeding or blood problems.   Previous surgery.   Other health problems.  BEFORE THE PROCEDURE  Laparoscopy can be done either in a hospital or out-patient clinic. You may be given a mild sedative to help you relax before the procedure. Once in the operating room, you will be given a general anesthesia to make you sleep (unless you and your caregiver choose a different anesthetic).  AFTER THE PROCEDURE  After the procedure you will be watched in a recovery area. Depending on what type of hernia was repaired, you might be admitted to the hospital or you might go home the same day. With this procedure you may have less pain and scarring. This usually results in a quicker recovery and less risk of infection. HOME CARE INSTRUCTIONS   Bed rest is not required. You may continue your normal activities but avoid heavy lifting (more than 10 pounds) or straining.   Cough gently. If you are a smoker it is best to stop, as even the best hernia repair can break down with the continual strain of coughing.   Avoid driving until given the OK by your surgeon.   There are no dietary restrictions unless given otherwise.   TAKE ALL MEDICATIONS AS DIRECTED.   Only take over-the-counter or prescription medicines for pain, discomfort, or fever as directed by your caregiver.  SEEK MEDICAL CARE IF:   There is increasing abdominal pain or pain in your incisions.     There is more bleeding from incisions, other than minimal spotting.   You feel light headed or faint.   You develop an unexplained fever, chills, and/or an oral temperature above 102 F (38.9 C).   You have redness, swelling, or increasing pain in the wound.   Pus coming from wound.   A foul smell coming from the wound or dressings.  SEEK IMMEDIATE MEDICAL CARE IF:   You develop a rash.   You have difficulty breathing.    You have any allergic problems.  MAKE SURE YOU:   Understand these instructions.   Will watch your condition.   Will get help right away if you are not doing well or get worse.  Document Released: 10/22/2005 Document Revised: 10/11/2011 Document Reviewed: 09/21/2009 Exit[employer] Patient Information 2012 [employer]. 

## 2012-08-11 ENCOUNTER — Other Ambulatory Visit (INDEPENDENT_AMBULATORY_CARE_PROVIDER_SITE_OTHER): Payer: Self-pay | Admitting: Surgery

## 2012-08-11 ENCOUNTER — Telehealth (INDEPENDENT_AMBULATORY_CARE_PROVIDER_SITE_OTHER): Payer: Self-pay | Admitting: General Surgery

## 2012-08-11 NOTE — Telephone Encounter (Signed)
Patient called in wanting refill on his 800mg  ibuprofen and hydrocodone 5/325 called into the [facility] in [place] [phone]...patient also stated that he wanted one pill for the ibuprofen instead of 4 pills of 200mg  a piece..he states this did not work as well..patient phone # [phone]

## 2014-11-15 ENCOUNTER — Other Ambulatory Visit (INDEPENDENT_AMBULATORY_CARE_PROVIDER_SITE_OTHER): Payer: Self-pay | Admitting: Surgery

## 2014-11-15 DIAGNOSIS — R1031 Right lower quadrant pain: Secondary | ICD-10-CM

## 2014-11-19 ENCOUNTER — Ambulatory Visit
Admission: RE | Admit: 2014-11-19 | Discharge: 2014-11-19 | Disposition: A | Discharge status: Home / Self Care | Payer: Managed Care, Other (non HMO) | Source: Ambulatory Visit | Attending: Surgery | Admitting: Surgery

## 2014-11-19 DIAGNOSIS — R1031 Right lower quadrant pain: Secondary | ICD-10-CM

## 2014-11-19 MED ORDER — IOHEXOL 300 MG/ML  SOLN
100.0000 mL | Freq: Once | INTRAMUSCULAR | Status: AC | PRN
  Administered 2014-11-19: 100 mL via INTRAVENOUS

## 2014-11-30 ENCOUNTER — Ambulatory Visit (INDEPENDENT_AMBULATORY_CARE_PROVIDER_SITE_OTHER): Payer: Self-pay | Admitting: Surgery
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
## 2014-12-25 NOTE — Pre-Procedure Instructions (Addendum)
Roger Marquez  12/25/2014   Your procedure is scheduled on:  March 1  Report to [facility] Admitting at 05:30 AM.  Call this number if you have problems the morning of surgery: [phone]   Remember:   Do not eat food or drink liquids after midnight.   Take these medicines the morning of surgery with A SIP OF WATER:pain med if needed       STOP all herbel meds, nsaids (aleve,naproxen,advil,ibuprofen) 5 days prior to surgery starting 12/30/14 including vitamins, aspirin   Do not wear jewelry, make-up or nail polish.  Do not wear lotions, powders, or perfumes. You may wear deodorant.  Do not shave 48 hours prior to surgery. Men may shave face and neck.  Do not bring valuables to the hospital.  [facility] is not responsible for any belongings or valuables.               Contacts, dentures or bridgework may not be worn into surgery.  Leave suitcase in the car. After surgery it may be brought to your room.  For patients admitted to the hospital, discharge time is determined by your treatment team.               Patients discharged the day of surgery will not be allowed to drive home.  Name and phone number of your driver: Family/ Friend  Special Instructions: [place] - Preparing for Surgery  Before surgery, you can play an important role.  Because skin is not sterile, your skin needs to be as free of germs as possible.  You can reduce the number of germs on you skin by washing with CHG (chlorahexidine gluconate) soap before surgery.  CHG is an antiseptic cleaner which kills germs and bonds with the skin to continue killing germs even after washing.  Please DO NOT use if you have an allergy to CHG or antibacterial soaps.  If your skin becomes reddened/irritated stop using the CHG and inform your nurse when you arrive at Short Stay.  Do not shave (including legs and underarms) for at least 48 hours prior to the first CHG shower.  You may shave your face.  Please follow  these instructions carefully:   1.  Shower with CHG Soap the night before surgery and the morning of Surgery.  2.  If you choose to wash your hair, wash your hair first as usual with your normal shampoo.  3.  After you shampoo, rinse your hair and body thoroughly to remove the shampoo.  4.  Use CHG as you would any other liquid soap.  You can apply CHG directly to the skin and wash gently with scrungie or a clean washcloth.  5.  Apply the CHG Soap to your body ONLY FROM THE NECK DOWN.  Do not use on open wounds or open sores.  Avoid contact with your eyes, ears, mouth and genitals (private parts).  Wash genitals (private parts) with your normal soap.  6.  Wash thoroughly, paying special attention to the area where your surgery will be performed.  7.  Thoroughly rinse your body with warm water from the neck down.  8.  DO NOT shower/wash with your normal soap after using and rinsing off the CHG Soap.  9.  Pat yourself dry with a clean towel.            10.  Wear clean pajamas.            11.  Place  clean sheets on your bed the night of your first shower and do not sleep with pets.  Day of Surgery  Do not apply any lotions the morning of surgery.  Please wear clean clothes to the hospital/surgery center.     Please read over the following fact sheets that you were given: Pain Booklet, Coughing and Deep Breathing and Surgical Site Infection Prevention

## 2014-12-27 ENCOUNTER — Encounter (HOSPITAL_COMMUNITY): Payer: Self-pay

## 2014-12-27 ENCOUNTER — Encounter (HOSPITAL_COMMUNITY)
Admission: RE | Admit: 2014-12-27 | Discharge: 2014-12-27 | Disposition: A | Discharge status: Home / Self Care | Payer: Managed Care, Other (non HMO) | Source: Ambulatory Visit | Attending: Surgery | Admitting: Surgery

## 2014-12-27 DIAGNOSIS — Z01818 Encounter for other preprocedural examination: Secondary | ICD-10-CM | POA: Diagnosis not present

## 2014-12-27 DIAGNOSIS — K4021 Bilateral inguinal hernia, without obstruction or gangrene, recurrent: Secondary | ICD-10-CM | POA: Insufficient documentation

## 2014-12-27 DIAGNOSIS — Z01812 Encounter for preprocedural laboratory examination: Secondary | ICD-10-CM | POA: Insufficient documentation

## 2014-12-27 HISTORY — DX: Other microscopic hematuria: R31.29

## 2014-12-27 HISTORY — DX: Other specified postprocedural states: Z98.890

## 2014-12-27 HISTORY — DX: Other complications of anesthesia, initial encounter: T88.59XA

## 2014-12-27 HISTORY — DX: Other specified postprocedural states: R11.2

## 2014-12-27 HISTORY — DX: Adverse effect of unspecified anesthetic, initial encounter: T41.45XA

## 2014-12-27 LAB — CBC WITH DIFFERENTIAL/PLATELET
Basophils Absolute: 0 10*3/uL (ref 0.0–0.1)
Basophils Relative: 0 % (ref 0–1)
EOS ABS: 0.2 10*3/uL (ref 0.0–0.7)
Eosinophils Relative: 3 % (ref 0–5)
HEMATOCRIT: 42.5 % (ref 39.0–52.0)
HEMOGLOBIN: 15 g/dL (ref 13.0–17.0)
Lymphocytes Relative: 35 % (ref 12–46)
Lymphs Abs: 2.5 10*3/uL (ref 0.7–4.0)
MCH: 29.2 pg (ref 26.0–34.0)
MCHC: 35.3 g/dL (ref 30.0–36.0)
MCV: 82.7 fL (ref 78.0–100.0)
MONO ABS: 0.6 10*3/uL (ref 0.1–1.0)
Monocytes Relative: 8 % (ref 3–12)
NEUTROS PCT: 54 % (ref 43–77)
Neutro Abs: 3.8 10*3/uL (ref 1.7–7.7)
Platelets: 232 10*3/uL (ref 150–400)
RBC: 5.14 MIL/uL (ref 4.22–5.81)
RDW: 12.9 % (ref 11.5–15.5)
WBC: 7.2 10*3/uL (ref 4.0–10.5)

## 2014-12-27 LAB — COMPREHENSIVE METABOLIC PANEL
ALT: 106 U/L — AB (ref 0–53)
AST: 56 U/L — AB (ref 0–37)
Albumin: 4.4 g/dL (ref 3.5–5.2)
Alkaline Phosphatase: 34 U/L — ABNORMAL LOW (ref 39–117)
Anion gap: 10 (ref 5–15)
BILIRUBIN TOTAL: 0.4 mg/dL (ref 0.3–1.2)
BUN: 14 mg/dL (ref 6–23)
CO2: 27 mmol/L (ref 19–32)
CREATININE: 0.9 mg/dL (ref 0.50–1.35)
Calcium: 9.2 mg/dL (ref 8.4–10.5)
Chloride: 103 mmol/L (ref 96–112)
GFR calc non Af Amer: >90 mL/min (ref >90)
Glucose, Bld: 120 mg/dL — ABNORMAL HIGH (ref 70–99)
Potassium: 3.8 mmol/L (ref 3.5–5.1)
Sodium: 140 mmol/L (ref 135–145)
Total Protein: 7.4 g/dL (ref 6.0–8.3)

## 2014-12-28 NOTE — Progress Notes (Signed)
Anesthesia Chart Review:  Pt is 29 year old male scheduled for B laparoscopic inguinal hernia repair with mesh on 01/04/2015 with Dr. Brantley Stage.   PMH includes: inguinal hernia. S/p inguinal hernia repair 03/2011.   Preoperative labs reviewed.  AST 56, ALT 106.  These results are consistent with results from 03/2011.  If no changes, I anticipate pt can proceed with surgery as scheduled.   Willeen Cass, FNP-BC [facility] Short Stay Surgical Center/Anesthesiology Phone: [phone] 12/28/2014 2:09 PM

## 2015-01-03 MED ORDER — CEFAZOLIN SODIUM-DEXTROSE 2-3 GM-% IV SOLR
2.0000 g | INTRAVENOUS | Status: AC
  Administered 2015-01-04: 2 g via INTRAVENOUS
  Filled 2015-01-03: qty 50

## 2015-01-03 MED ORDER — CHLORHEXIDINE GLUCONATE 4 % EX LIQD
1.0000 "application " | Freq: Once | CUTANEOUS | Status: DC
  Filled 2015-01-03: qty 15

## 2015-01-04 ENCOUNTER — Encounter (HOSPITAL_COMMUNITY)
Admission: RE | Disposition: A | Discharge status: Home / Self Care | Payer: Self-pay | Source: Ambulatory Visit | Attending: Surgery

## 2015-01-04 ENCOUNTER — Ambulatory Visit (HOSPITAL_COMMUNITY): Payer: Managed Care, Other (non HMO) | Admitting: Emergency Medicine

## 2015-01-04 ENCOUNTER — Other Ambulatory Visit (INDEPENDENT_AMBULATORY_CARE_PROVIDER_SITE_OTHER): Payer: Self-pay | Admitting: Surgery

## 2015-01-04 ENCOUNTER — Encounter (HOSPITAL_COMMUNITY): Payer: Self-pay | Admitting: *Deleted

## 2015-01-04 ENCOUNTER — Ambulatory Visit (HOSPITAL_COMMUNITY): Payer: Managed Care, Other (non HMO) | Admitting: Anesthesiology

## 2015-01-04 ENCOUNTER — Ambulatory Visit (HOSPITAL_COMMUNITY)
Admission: RE | Admit: 2015-01-04 | Discharge: 2015-01-04 | Disposition: A | Discharge status: Home / Self Care | Payer: Managed Care, Other (non HMO) | Source: Ambulatory Visit | Attending: Surgery | Admitting: Surgery

## 2015-01-04 DIAGNOSIS — K4020 Bilateral inguinal hernia, without obstruction or gangrene, not specified as recurrent: Secondary | ICD-10-CM | POA: Diagnosis present

## 2015-01-04 DIAGNOSIS — K4090 Unilateral inguinal hernia, without obstruction or gangrene, not specified as recurrent: Secondary | ICD-10-CM | POA: Insufficient documentation

## 2015-01-04 DIAGNOSIS — K4091 Unilateral inguinal hernia, without obstruction or gangrene, recurrent: Secondary | ICD-10-CM | POA: Insufficient documentation

## 2015-01-04 DIAGNOSIS — Z9889 Other specified postprocedural states: Secondary | ICD-10-CM

## 2015-01-04 HISTORY — PX: INGUINAL HERNIA REPAIR: SHX194

## 2015-01-04 HISTORY — PX: INSERTION OF MESH: SHX5868

## 2015-01-04 SURGERY — REPAIR, HERNIA, INGUINAL, LAPAROSCOPIC
Anesthesia: General | Laterality: Bilateral

## 2015-01-04 MED ORDER — FENTANYL CITRATE 0.05 MG/ML IJ SOLN
INTRAMUSCULAR | Status: AC
  Filled 2015-01-04: qty 5

## 2015-01-04 MED ORDER — LACTATED RINGERS IV SOLN
INTRAVENOUS | Status: DC | PRN
  Administered 2015-01-04 (×2): via INTRAVENOUS

## 2015-01-04 MED ORDER — SCOPOLAMINE 1 MG/3DAYS TD PT72
MEDICATED_PATCH | TRANSDERMAL | Status: DC | PRN
  Administered 2015-01-04: 1.5 mg via TRANSDERMAL

## 2015-01-04 MED ORDER — LACTATED RINGERS IV SOLN: INTRAVENOUS | Status: DC

## 2015-01-04 MED ORDER — DEXAMETHASONE SODIUM PHOSPHATE 4 MG/ML IJ SOLN
INTRAMUSCULAR | Status: AC
  Filled 2015-01-04: qty 2

## 2015-01-04 MED ORDER — METOCLOPRAMIDE HCL 5 MG/ML IJ SOLN
INTRAMUSCULAR | Status: DC | PRN
  Administered 2015-01-04: 10 mg via INTRAVENOUS

## 2015-01-04 MED ORDER — BUPIVACAINE-EPINEPHRINE 0.25% -1:200000 IJ SOLN
INTRAMUSCULAR | Status: DC | PRN
  Administered 2015-01-04: 5 mL

## 2015-01-04 MED ORDER — HYDROMORPHONE HCL 1 MG/ML IJ SOLN
INTRAMUSCULAR | Status: AC
  Administered 2015-01-04: 0.5 mg via INTRAVENOUS
  Filled 2015-01-04: qty 1

## 2015-01-04 MED ORDER — DEXAMETHASONE SODIUM PHOSPHATE 4 MG/ML IJ SOLN
INTRAMUSCULAR | Status: DC | PRN
  Administered 2015-01-04: 8 mg via INTRAVENOUS

## 2015-01-04 MED ORDER — PROMETHAZINE HCL 25 MG/ML IJ SOLN
6.2500 mg | INTRAMUSCULAR | Status: AC | PRN
  Administered 2015-01-04 (×2): 6.25 mg via INTRAVENOUS

## 2015-01-04 MED ORDER — MIDAZOLAM HCL 2 MG/2ML IJ SOLN
INTRAMUSCULAR | Status: AC
  Filled 2015-01-04: qty 2

## 2015-01-04 MED ORDER — KETOROLAC TROMETHAMINE 30 MG/ML IJ SOLN
30.0000 mg | Freq: Once | INTRAMUSCULAR | Status: AC | PRN
  Administered 2015-01-04: 30 mg via INTRAVENOUS

## 2015-01-04 MED ORDER — FENTANYL CITRATE 0.05 MG/ML IJ SOLN
INTRAMUSCULAR | Status: DC | PRN
  Administered 2015-01-04: 100 ug via INTRAVENOUS
  Administered 2015-01-04: 50 ug via INTRAVENOUS
  Administered 2015-01-04 (×2): 100 ug via INTRAVENOUS
  Administered 2015-01-04 (×3): 50 ug via INTRAVENOUS

## 2015-01-04 MED ORDER — BUPIVACAINE-EPINEPHRINE (PF) 0.25% -1:200000 IJ SOLN
INTRAMUSCULAR | Status: AC
  Filled 2015-01-04: qty 30

## 2015-01-04 MED ORDER — OXYCODONE HCL 5 MG PO TABS
ORAL_TABLET | ORAL | Status: AC
  Filled 2015-01-04: qty 2

## 2015-01-04 MED ORDER — METOCLOPRAMIDE HCL 5 MG/ML IJ SOLN
INTRAMUSCULAR | Status: AC
  Filled 2015-01-04: qty 2

## 2015-01-04 MED ORDER — GLYCOPYRROLATE 0.2 MG/ML IJ SOLN
INTRAMUSCULAR | Status: DC | PRN
  Administered 2015-01-04: 0.6 mg via INTRAVENOUS

## 2015-01-04 MED ORDER — LIDOCAINE HCL (CARDIAC) 20 MG/ML IV SOLN
INTRAVENOUS | Status: AC
  Filled 2015-01-04: qty 10

## 2015-01-04 MED ORDER — PROPOFOL 10 MG/ML IV BOLUS
INTRAVENOUS | Status: AC
  Filled 2015-01-04: qty 20

## 2015-01-04 MED ORDER — ARTIFICIAL TEARS OP OINT
TOPICAL_OINTMENT | OPHTHALMIC | Status: DC | PRN
  Administered 2015-01-04: 1 via OPHTHALMIC

## 2015-01-04 MED ORDER — PROPOFOL 10 MG/ML IV BOLUS
INTRAVENOUS | Status: DC | PRN
  Administered 2015-01-04: 200 mg via INTRAVENOUS
  Administered 2015-01-04: 50 mg via INTRAVENOUS
  Administered 2015-01-04: 30 mg via INTRAVENOUS

## 2015-01-04 MED ORDER — PROMETHAZINE HCL 25 MG/ML IJ SOLN
INTRAMUSCULAR | Status: AC
  Administered 2015-01-04: 6.25 mg via INTRAVENOUS
  Filled 2015-01-04: qty 1

## 2015-01-04 MED ORDER — ARTIFICIAL TEARS OP OINT
TOPICAL_OINTMENT | OPHTHALMIC | Status: AC
  Filled 2015-01-04: qty 3.5

## 2015-01-04 MED ORDER — MIDAZOLAM HCL 5 MG/5ML IJ SOLN
INTRAMUSCULAR | Status: DC | PRN
  Administered 2015-01-04: 2 mg via INTRAVENOUS

## 2015-01-04 MED ORDER — OXYCODONE-ACETAMINOPHEN 5-325 MG PO TABS: 1.0000 | ORAL_TABLET | ORAL | Status: DC | PRN

## 2015-01-04 MED ORDER — PROMETHAZINE HCL 12.5 MG PO TABS: 12.5000 mg | ORAL_TABLET | Freq: Four times a day (QID) | ORAL | Status: DC | PRN

## 2015-01-04 MED ORDER — SCOPOLAMINE 1 MG/3DAYS TD PT72
MEDICATED_PATCH | TRANSDERMAL | Status: AC
  Filled 2015-01-04: qty 1

## 2015-01-04 MED ORDER — ONDANSETRON HCL 4 MG/2ML IJ SOLN
INTRAMUSCULAR | Status: AC
  Filled 2015-01-04: qty 4

## 2015-01-04 MED ORDER — KETOROLAC TROMETHAMINE 30 MG/ML IJ SOLN
INTRAMUSCULAR | Status: AC
  Filled 2015-01-04: qty 1

## 2015-01-04 MED ORDER — 0.9 % SODIUM CHLORIDE (POUR BTL) OPTIME
TOPICAL | Status: DC | PRN
  Administered 2015-01-04: 1000 mL

## 2015-01-04 MED ORDER — ROCURONIUM BROMIDE 50 MG/5ML IV SOLN
INTRAVENOUS | Status: AC
  Filled 2015-01-04: qty 1

## 2015-01-04 MED ORDER — ROCURONIUM BROMIDE 100 MG/10ML IV SOLN
INTRAVENOUS | Status: DC | PRN
  Administered 2015-01-04: 40 mg via INTRAVENOUS
  Administered 2015-01-04: 10 mg via INTRAVENOUS

## 2015-01-04 MED ORDER — OXYCODONE HCL 5 MG PO TABS
10.0000 mg | ORAL_TABLET | ORAL | Status: DC | PRN
  Administered 2015-01-04: 10 mg via ORAL

## 2015-01-04 MED ORDER — ONDANSETRON HCL 4 MG/2ML IJ SOLN
INTRAMUSCULAR | Status: DC | PRN
  Administered 2015-01-04 (×2): 4 mg via INTRAVENOUS

## 2015-01-04 MED ORDER — GLYCOPYRROLATE 0.2 MG/ML IJ SOLN
INTRAMUSCULAR | Status: AC
  Filled 2015-01-04: qty 3

## 2015-01-04 MED ORDER — NEOSTIGMINE METHYLSULFATE 10 MG/10ML IV SOLN
INTRAVENOUS | Status: DC | PRN
  Administered 2015-01-04: 5 mg via INTRAVENOUS

## 2015-01-04 MED ORDER — LIDOCAINE HCL (CARDIAC) 20 MG/ML IV SOLN
INTRAVENOUS | Status: DC | PRN
  Administered 2015-01-04: 80 mg via INTRAVENOUS
  Administered 2015-01-04: 50 mg via INTRAVENOUS

## 2015-01-04 MED ORDER — HYDROMORPHONE HCL 1 MG/ML IJ SOLN
0.2500 mg | INTRAMUSCULAR | Status: DC | PRN
  Administered 2015-01-04 (×2): 0.5 mg via INTRAVENOUS

## 2015-01-04 MED ORDER — PHENYLEPHRINE 40 MCG/ML (10ML) SYRINGE FOR IV PUSH (FOR BLOOD PRESSURE SUPPORT)
PREFILLED_SYRINGE | INTRAVENOUS | Status: AC
  Filled 2015-01-04: qty 10

## 2015-01-04 SURGICAL SUPPLY — 45 items
APPLIER CLIP LOGIC TI 5 (MISCELLANEOUS) IMPLANT
APR CLP MED LRG 33X5 (MISCELLANEOUS)
BLADE SURG ROTATE 9660 (MISCELLANEOUS) IMPLANT
CANISTER SUCTION 2500CC (MISCELLANEOUS) IMPLANT
CHLORAPREP W/TINT 26ML (MISCELLANEOUS) ×3 IMPLANT
COVER SURGICAL LIGHT HANDLE (MISCELLANEOUS) ×3 IMPLANT
DEVICE RELIATACK FIXATION (MISCELLANEOUS) ×2 IMPLANT
DEVICE SECURE STRAP 25 ABSORB (INSTRUMENTS) ×1 IMPLANT
DISSECT BALLN SPACEMKR + OVL (BALLOONS)
DISSECTOR BALLN SPACEMKR + OVL (BALLOONS) ×1 IMPLANT
DISSECTOR BLUNT TIP ENDO 5MM (MISCELLANEOUS) IMPLANT
DRAPE LAPAROSCOPIC ABDOMINAL (DRAPES) ×3 IMPLANT
DRAPE WARM FLUID 44X44 (DRAPE) ×3 IMPLANT
ELECT REM PT RETURN 9FT ADLT (ELECTROSURGICAL) ×3
ELECTRODE REM PT RTRN 9FT ADLT (ELECTROSURGICAL) ×1 IMPLANT
GLOVE BIO SURGEON STRL SZ 6.5 (GLOVE) ×1 IMPLANT
GLOVE BIO SURGEON STRL SZ7 (GLOVE) ×2 IMPLANT
GLOVE BIO SURGEON STRL SZ8 (GLOVE) ×3 IMPLANT
GLOVE BIO SURGEONS STRL SZ 6.5 (GLOVE) ×1
GLOVE BIOGEL PI IND STRL 7.0 (GLOVE) IMPLANT
GLOVE BIOGEL PI IND STRL 7.5 (GLOVE) IMPLANT
GLOVE BIOGEL PI IND STRL 8 (GLOVE) ×1 IMPLANT
GLOVE BIOGEL PI INDICATOR 7.0 (GLOVE) ×6
GLOVE BIOGEL PI INDICATOR 7.5 (GLOVE) ×2
GLOVE BIOGEL PI INDICATOR 8 (GLOVE) ×2
GOWN STRL REUS W/ TWL LRG LVL3 (GOWN DISPOSABLE) ×3 IMPLANT
GOWN STRL REUS W/ TWL XL LVL3 (GOWN DISPOSABLE) ×1 IMPLANT
GOWN STRL REUS W/TWL LRG LVL3 (GOWN DISPOSABLE) ×6
GOWN STRL REUS W/TWL XL LVL3 (GOWN DISPOSABLE) ×3
KIT BASIN OR (CUSTOM PROCEDURE TRAY) ×3 IMPLANT
KIT ROOM TURNOVER OR (KITS) ×3 IMPLANT
LIQUID BAND (GAUZE/BANDAGES/DRESSINGS) ×3 IMPLANT
MESH ULTRAPRO 3X6 7.6X15CM (Mesh General) ×4 IMPLANT
NS IRRIG 1000ML POUR BTL (IV SOLUTION) ×3 IMPLANT
PAD ARMBOARD 7.5X6 YLW CONV (MISCELLANEOUS) ×6 IMPLANT
SET IRRIG TUBING LAPAROSCOPIC (IRRIGATION / IRRIGATOR) IMPLANT
SUT MNCRL AB 4-0 PS2 18 (SUTURE) ×3 IMPLANT
TOWEL OR 17X24 6PK STRL BLUE (TOWEL DISPOSABLE) ×3 IMPLANT
TOWEL OR 17X26 10 PK STRL BLUE (TOWEL DISPOSABLE) ×3 IMPLANT
TRAY FOLEY CATH 16FR SILVER (SET/KITS/TRAYS/PACK) ×3 IMPLANT
TRAY LAPAROSCOPIC (CUSTOM PROCEDURE TRAY) ×3 IMPLANT
TROCAR XCEL BLADELESS 5X75MML (TROCAR) ×6 IMPLANT
TROCAR XCEL BLUNT TIP 100MML (ENDOMECHANICALS) ×2 IMPLANT
TUBING INSUFFLATION (TUBING) ×3 IMPLANT
WATER STERILE IRR 1000ML POUR (IV SOLUTION) IMPLANT
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
## 2015-01-04 NOTE — Progress Notes (Signed)
Pt called to say he is running late and he will be here in 45 minutes.  OR notified and Dr. Brantley Stage paged.

## 2015-01-04 NOTE — Discharge Instructions (Signed)
What to eat: ° °For your first meals, you should eat lightly; only small meals initially.  If you do not have nausea, you may eat larger meals.  Avoid spicy, greasy and heavy food.   ° °General Anesthesia, Adult, Care After  °Refer to this sheet in the next few weeks. These instructions provide you with information on caring for yourself after your procedure. Your health care provider may also give you more specific instructions. Your treatment has been planned according to current medical practices, but problems sometimes occur. Call your health care provider if you have any problems or questions after your procedure.  °WHAT TO EXPECT AFTER THE PROCEDURE  °After the procedure, it is typical to experience:  °Sleepiness.  °Nausea and vomiting. °HOME CARE INSTRUCTIONS  °For the first 24 hours after general anesthesia:  °Have a responsible person with you.  °Do not drive a car. If you are alone, do not take public transportation.  °Do not drink alcohol.  °Do not take medicine that has not been prescribed by your health care provider.  °Do not sign important papers or make important decisions.  °You may resume a normal diet and activities as directed by your health care provider.  °Change bandages (dressings) as directed.  °If you have questions or problems that seem related to general anesthesia, call the hospital and ask for the anesthetist or anesthesiologist on call. °SEEK MEDICAL CARE IF:  °You have nausea and vomiting that continue the day after anesthesia.  °You develop a rash. °SEEK IMMEDIATE MEDICAL CARE IF:  °You have difficulty breathing.  °You have chest pain.  °You have any allergic problems. °Document Released: 01/28/2001 Document Revised: 06/24/2013 Document Reviewed: 05/07/2013  °ExitCare® Patient Information ©2014 [employer].  ° °Sore Throat  ° ° °A sore throat is a painful, burning, sore, or scratchy feeling of the throat. There may be pain or tenderness when swallowing or talking. You may have  other symptoms with a sore throat. These include coughing, sneezing, fever, or a swollen neck. A sore throat is often the first sign of another sickness. These sicknesses may include a cold, flu, strep throat, or an infection called mono. Most sore throats go away without medical treatment.  °HOME CARE  °Only take medicine as told by your doctor.  °Drink enough fluids to keep your pee (urine) clear or pale yellow.  °Rest as needed.  °Try using throat sprays, lozenges, or suck on hard candy (if older than 4 years or as told).  °Sip warm liquids, such as broth, herbal tea, or warm water with honey. Try sucking on frozen ice pops or drinking cold liquids.  °Rinse the mouth (gargle) with salt water. Mix 1 teaspoon salt with 8 ounces of water.  °Do not smoke. Avoid being around others when they are smoking.  °Put a humidifier in your bedroom at night to moisten the air. You can also turn on a hot shower and sit in the bathroom for 5-10 minutes. Be sure the bathroom door is closed. °GET HELP RIGHT AWAY IF:  °You have trouble breathing.  °You cannot swallow fluids, soft foods, or your spit (saliva).  °You have more puffiness (swelling) in the throat.  °Your sore throat does not get better in 7 days.  °You feel sick to your stomach (nauseous) and throw up (vomit).  °You have a fever or lasting symptoms for more than 2-3 days.  °You have a fever and your symptoms suddenly get worse. °MAKE SURE YOU:  °Understand these   instructions.  Will watch your condition.  Will get help right away if you are not doing well or get worse. Document Released: 07/31/2008 Document Revised: 07/16/2012 Document Reviewed: 06/29/2012  [facility] Patient Information 2015 [place], [place]. This information is not intended to replace advice given to you by your health care provider. Make sure you discuss any questions you have with your health care provider.    [facility] _______[facility]  UMBILICAL OR INGUINAL HERNIA REPAIR: POST OP  INSTRUCTIONS  Always review your discharge instruction sheet given to you by the facility where your surgery was performed. IF YOU HAVE DISABILITY OR FAMILY LEAVE FORMS, YOU MUST BRING THEM TO THE OFFICE FOR PROCESSING.   DO NOT GIVE THEM TO YOUR DOCTOR.  1. A  prescription for pain medication may be given to you upon discharge.  Take your pain medication as prescribed, if needed.  If narcotic pain medicine is not needed, then you may take acetaminophen (Tylenol) or ibuprofen (Advil) as needed. 2. Take your usually prescribed medications unless otherwise directed. 3. If you need a refill on your pain medication, please contact your pharmacy.  They will contact our office to request authorization. Prescriptions will not be filled after 5 pm or on week-ends. 4. You should follow a light diet the first 24 hours after arrival home, such as soup and crackers, etc.  Be sure to include lots of fluids daily.  Resume your normal diet the day after surgery. 5. Most patients will experience some swelling and bruising around the umbilicus or in the groin and scrotum.  Ice packs and reclining will help.  Swelling and bruising can take several days to resolve.  6. It is common to experience some constipation if taking pain medication after surgery.  Increasing fluid intake and taking a stool softener (such as Colace) will usually help or prevent this problem from occurring.  A mild laxative (Milk of Magnesia or Miralax) should be taken according to package directions if there are no bowel movements after 48 hours. 7. Unless discharge instructions indicate otherwise, you may remove your bandages 24-48 hours after surgery, and you may shower at that time.  You may have steri-strips (small skin tapes) in place directly over the incision.  These strips should be left on the skin for 7-10 days.  If your surgeon used skin glue on the incision, you may shower in 24 hours.  The glue will flake off over the next 2-3 weeks.   Any sutures or staples will be removed at the office during your follow-up visit. 8. ACTIVITIES:  You may resume regular (light) daily activities beginning the next day--such as daily self-care, walking, climbing stairs--gradually increasing activities as tolerated.  You may have sexual intercourse when it is comfortable.  Refrain from any heavy lifting or straining until approved by your doctor. a. You may drive when you are no longer taking prescription pain medication, you can comfortably wear a seatbelt, and you can safely maneuver your car and apply brakes. b. RETURN TO WORK:  __________________________________________________________ 9. You should see your doctor in the office for a follow-up appointment approximately 2-3 weeks after your surgery.  Make sure that you call for this appointment within a day or two after you arrive home to insure a convenient appointment time. 10. OTHER INSTRUCTIONS:  __________________________________________________________________________________________________________________________________________________________________________________________  WHEN TO CALL YOUR DOCTOR: 1. Fever over 101.0 2. Inability to urinate 3. Nausea and/or vomiting 4. Extreme swelling or bruising 5. Continued bleeding from incision. 6. Increased pain,  redness, or drainage from the incision  The clinic staff is available to answer your questions during regular business hours.  Please dont hesitate to call and ask to speak to one of the nurses for clinical concerns.  If you have a medical emergency, go to the nearest emergency room or call 911.  A surgeon from [facility] is always on call at the hospital   [address] [place], [place], [place]  [address] ?  [address], [place], [place]   [address] [phone] ? [phone] ? FAX [phone] Web site: [email or web address]

## 2015-01-04 NOTE — Anesthesia Procedure Notes (Signed)
Procedure Name: Intubation Date/Time: 01/04/2015 7:45 AM Performed by: Jacquiline Doe A Pre-anesthesia Checklist: Patient identified, Timeout performed, Emergency Drugs available, Suction available and Patient being monitored Patient Re-evaluated:Patient Re-evaluated prior to inductionOxygen Delivery Method: Circle system utilized Preoxygenation: Pre-oxygenation with 100% oxygen Intubation Type: IV induction and Cricoid Pressure applied Ventilation: Mask ventilation without difficulty and Oral airway inserted - appropriate to patient size Laryngoscope Size: Mac and 4 Grade View: Grade I Tube type: Oral Tube size: 7.5 mm Number of attempts: 1 Airway Equipment and Method: Stylet Placement Confirmation: ETT inserted through vocal cords under direct vision,  breath sounds checked- equal and bilateral and positive ETCO2 Secured at: 23 cm Tube secured with: Tape Dental Injury: Teeth and Oropharynx as per pre-operative assessment

## 2015-01-04 NOTE — Anesthesia Preprocedure Evaluation (Signed)
Anesthesia Evaluation  Patient identified by MRN, date of birth, ID band Patient awake    Reviewed: Allergy & Precautions, NPO status , Patient's Chart, lab work & pertinent test results  History of Anesthesia Complications (+) PONV  Airway Mallampati: II  TM Distance: >3 FB Neck ROM: Full    Dental no notable dental hx.    Pulmonary neg pulmonary ROS,  breath sounds clear to auscultation  Pulmonary exam normal       Cardiovascular negative cardio ROS  Rhythm:Regular Rate:Normal     Neuro/Psych negative neurological ROS  negative psych ROS   GI/Hepatic negative GI ROS, Neg liver ROS,   Endo/Other  negative endocrine ROS  Renal/GU negative Renal ROS  negative genitourinary   Musculoskeletal negative musculoskeletal ROS (+)   Abdominal   Peds negative pediatric ROS (+)  Hematology negative hematology ROS (+)   Anesthesia Other Findings   Reproductive/Obstetrics negative OB ROS                             Anesthesia Physical Anesthesia Plan  ASA: I  Anesthesia Plan: General   Post-op Pain Management:    Induction: Intravenous  Airway Management Planned: Oral ETT  Additional Equipment:   Intra-op Plan:   Post-operative Plan: Extubation in OR  Informed Consent: I have reviewed the patients History and Physical, chart, labs and discussed the procedure including the risks, benefits and alternatives for the proposed anesthesia with the patient or authorized representative who has indicated his/her understanding and acceptance.   Dental advisory given  Plan Discussed with: CRNA and Surgeon  Anesthesia Plan Comments:         Anesthesia Quick Evaluation

## 2015-01-04 NOTE — Brief Op Note (Signed)
01/04/2015  10:02 AM  PATIENT:  Elige Ko  29 y.o. male  PRE-OPERATIVE DIAGNOSIS:  Bilateral Recurrent Inguinal Hernias  POST-OPERATIVE DIAGNOSIS:  Bilateral Recurrent Inguinal Hernias  PROCEDURE:  Procedure(s): LAPAROSCOPIC BILATERAL INGUINAL HERNIA REPAIR WITH MESH (Bilateral) INSERTION OF MESH (Bilateral)  SURGEON:  Surgeon(s) and Role:    * Erroll Luna, MD - Primary      ANESTHESIA:   general  EBL:  Total I/O In: 1500 [I.V.:1500] Out: 200 [Urine:200]  BLOOD ADMINISTERED:none  DRAINS: none   LOCAL MEDICATIONS USED:  NONE  SPECIMEN:  No Specimen  DISPOSITION OF SPECIMEN:  N/A  COUNTS:  YES  TOURNIQUET:  * No tourniquets in log *  DICTATION: .Other Dictation: Dictation Number  [id number]  PLAN OF CARE: Discharge to home after PACU  PATIENT DISPOSITION:  PACU - hemodynamically stable.   Delay start of Pharmacological VTE agent (>24hrs) due to surgical blood loss or risk of bleeding: not applicable

## 2015-01-04 NOTE — Transfer of Care (Signed)
Immediate Anesthesia Transfer of Care Note  Patient: Roger Marquez  Procedure(s) Performed: Procedure(s): LAPAROSCOPIC BILATERAL INGUINAL HERNIA REPAIR WITH MESH (Bilateral) INSERTION OF MESH (Bilateral)  Patient Location: PACU  Anesthesia Type:General  Level of Consciousness: oriented, sedated, patient cooperative and responds to stimulation  Airway & Oxygen Therapy: Patient Spontanous Breathing and Patient connected to nasal cannula oxygen  Post-op Assessment: Report given to RN, Post -op Vital signs reviewed and stable, Patient moving all extremities and Patient moving all extremities X 4  Post vital signs: Reviewed and stable  Last Vitals:  Filed Vitals:   01/04/15 0648  BP: 141/83  Pulse: 86  Temp:   Resp: 16    Complications: No apparent anesthesia complications

## 2015-01-04 NOTE — Interval H&P Note (Signed)
History and Physical Interval Note:  01/04/2015 7:07 AM  Roger Marquez  has presented today for surgery, with the diagnosis of Bilateral Recurrent Inguinal Hernias  The various methods of treatment have been discussed with the patient and family. After consideration of risks, benefits and other options for treatment, the patient has consented to  Procedure(s): LAPAROSCOPIC BILATERAL INGUINAL HERNIA REPAIR WITH MESH (Bilateral) INSERTION OF MESH (Bilateral) as a surgical intervention .  The patient's history has been reviewed, patient examined, no change in status, stable for surgery.  I have reviewed the patient's chart and labs.  Questions were answered to the patient's satisfaction.     [name]

## 2015-01-04 NOTE — Anesthesia Postprocedure Evaluation (Signed)
  Anesthesia Post-op Note  Patient: Roger Marquez  Procedure(s) Performed: Procedure(s) (LRB): LAPAROSCOPIC BILATERAL INGUINAL HERNIA REPAIR WITH MESH (Bilateral) INSERTION OF MESH (Bilateral)  Patient Location: PACU  Anesthesia Type: General  Level of Consciousness: awake and alert   Airway and Oxygen Therapy: Patient Spontanous Breathing  Post-op Pain: mild  Post-op Assessment: Post-op Vital signs reviewed, Patient's Cardiovascular Status Stable, Respiratory Function Stable, Patent Airway and No signs of Nausea or vomiting  Last Vitals:  Filed Vitals:   01/04/15 1030  BP: 115/47  Pulse: 90  Temp:   Resp: 27    Post-op Vital Signs: stable   Complications: No apparent anesthesia complications

## 2015-01-04 NOTE — Progress Notes (Signed)
Report given to maria rn as caregiver 

## 2015-01-05 ENCOUNTER — Encounter (HOSPITAL_COMMUNITY): Payer: Self-pay | Admitting: Surgery

## 2015-01-05 NOTE — Op Note (Signed)
NAMECOLESON, KANT               ACCOUNT NO.:  [id number]  MEDICAL RECORD NO.:  [id number]  LOCATION:                                 FACILITY:  PHYSICIAN:  Marcello Moores A. Aliany Fiorenza, M.D.DATE OF BIRTH:  [birth year]  DATE OF PROCEDURE:  01/04/2015 DATE OF DISCHARGE:  01/04/2015                              OPERATIVE REPORT   PREOPERATIVE DIAGNOSES: 1. Recurrent right inguinal hernia. 2. Small left inguinal hernia.  POSTOPERATIVE DIAGNOSIS:  Attenuation of bilateral direct inguinal canals without obvious hernia recurrence.  PROCEDURE:  Laparoscopic repair of bilateral inguinal hernia, specifically cannulation of inguinal canal and direct inguinal triangle.  SURGEON:  Marcello Moores A. Osmond Steckman, M.D.  ANESTHESIA:  General endotracheal anesthesia.  EBL:  Minimal.  SPECIMENS:  None.  IV FLUIDS:  2 L of crystalloid.  INDICATIONS FOR PROCEDURE:  The patient is a 29 year old male with a previous right inguinal hernia repair back in 2012.  He has  had problems with chronic pain and this was secondary to a Workmen's Compensation case.  He has been followed for number of years and actually had been feeling better but developed more tearing right groin pain.  He also by CT had a possible left small left inguinal hernia.  On exam, his exam was normal,  but he continued to have pain and I discussed laparoscopy as a means of treating this.  I discussed the pros and cons of this.  I discussed the fact that he may be no better after surgery.  I discussed the fact and potential complications of injuring the bladder, iliac vessels, nerves, major blood vessels to his legs, intraabdominal viscera,  small bowel, large bowel, rectum, prostate, bladder, urethra, and other intraabdominal organs are potentially at risk.  I would predict the benefits of surgery to be about 50% on treating this condition and making his symptoms better.  We had a long discussion about non-operative management and operative  management, and after discussing this, he wished to proceed with surgical laparoscopy and possible right inguinal hernia repair.  DESCRIPTION OF PROCEDURE:  The patient was met in the holding area. Questions were answered.  He was taken back to the operating room and placed supine on the OR table.  Both arms were tucked and general anesthesia was initiated.  Time-out was done and Foley catheter was placed under sterile conditions.  This was done  non-traumatically. After time-out was done, a sterile prep and drape were done, a 1 cm infraumbilical incision was made.  Dissection was carried down to the fascia.  The fascia was opened in the midline.  The abdominal cavity was entered.  Pursestring suture of 0 Vicryl was placed and Hasson 12 mm port was placed.  This was done atraumatically.  He was placed in Trendelenburg.  Upon initial examination, I did not see a large hernia defect on either side.  There was minimal scarring on the right side.  I elected to go ahead and take down the peritoneum to get a better look at these areas to see if there was a small recurrent hernia that I could not see easily.  Using the hook, I placed two 5 mm ports in the right and  left lower quadrants respectively.  Using a 5 mm scope, I used the hook cautery to open the preperitoneal space well above the bladder. The bladder was well decompressed and away from [place].  This space was developed all the way down around the right cord structures.  There is mesh in the right side and a preperitoneal component was noted, and I able to dissect the fat away from this, preserving it.  I saw some attenuation of the direct inguinal canal but no obvious hernia that I could see.  It looked weakened and flaccid but there was not any defect per se there.  I felt that the reinforcement with mesh would be helpful to him since he does have discomfort there and it could be from this area weakened and causing pain with stranding.   I then went ahead and opened up the left inguinal region to the pre-peritoneum in a similar fashion.  Cord structures were identified, and I could dissect circumferentially around them.  I preserved the epigastric vessels and stayed well away from the iliac vessels.  I decided to place two 3 inch x 6 inch pieces of mesh, one on the left and one on the right.  I cut a small slit, so the mesh would lay around the cord without strangling it. This was then placed all way down to the pubic tubercle, and then down below Cooper's ligament and along the inferior portion of the inguinal ligament.  Care was taken not to injure any structures.  The bladder was well away.  I placed a few scattered absorbable tacks in both pieces of mesh to help lay out flap,  but I did not try to tack it down towards the bladder.  It laid there nicely in a slit, enable to mesh to lay over the cord structures without causing any impingement.  Care was taken not to place any tacks laterally, where the neurovascular bundles were located.  This covered everywhere well.  The left side had a lot of attenuation but no obvious direct or indirect hernia but again both direct inguinal canals appeared weakened and flaccid.  The mesh covered these nicely.  There was no evidence of any bleeding or leakage of urine from the bladder.  I then used the tacker to pull the peritoneum up to cover the mesh and to secure it circumferentially, where we had disconnected it.  Four-quadrant laparoscopy was performed which showed no evidence of injury to hollow or solid organ.  There was no signs of bleeding.  The urine remained clear throughout the case.  We then removed our ports.  I closed the umbilical port site with 0 Vicryl and the skin incisions were all closed with 4-0 Monocryl after releasing the pneumoperitoneum.  All final counts of sponge, needle, and instruments were found to be correct in this portion of the case.  The patient  was then awoke, extubated, and taken to recovery in satisfactory condition. The Foley was removed prior to extubation and the urine was clear.     Jhayden Demuro A. Suanne Minahan, M.D.     TAC/MEDQ  D:  01/04/2015  T:  01/05/2015  Job:  [id number]

## 2016-01-17 ENCOUNTER — Encounter: Payer: Self-pay | Admitting: *Deleted

## 2016-01-17 ENCOUNTER — Emergency Department (INDEPENDENT_AMBULATORY_CARE_PROVIDER_SITE_OTHER)
Admission: EM | Admit: 2016-01-17 | Discharge: 2016-01-17 | Disposition: A | Discharge status: Home / Self Care | Payer: BLUE CROSS/BLUE SHIELD | Source: Home / Self Care | Attending: Family Medicine | Admitting: Family Medicine

## 2016-01-17 ENCOUNTER — Emergency Department (INDEPENDENT_AMBULATORY_CARE_PROVIDER_SITE_OTHER): Payer: BLUE CROSS/BLUE SHIELD

## 2016-01-17 DIAGNOSIS — J101 Influenza due to other identified influenza virus with other respiratory manifestations: Secondary | ICD-10-CM

## 2016-01-17 DIAGNOSIS — J111 Influenza due to unidentified influenza virus with other respiratory manifestations: Secondary | ICD-10-CM | POA: Diagnosis not present

## 2016-01-17 MED ORDER — PREDNISONE 20 MG PO TABS: 20.0000 mg | ORAL_TABLET | Freq: Two times a day (BID) | ORAL | Status: DC

## 2016-01-17 MED ORDER — AZITHROMYCIN 250 MG PO TABS: ORAL_TABLET | ORAL | Status: DC

## 2016-01-17 MED ORDER — GUAIFENESIN-CODEINE 100-10 MG/5ML PO SOLN: ORAL | Status: DC

## 2016-01-17 NOTE — Discharge Instructions (Signed)

## 2016-01-17 NOTE — ED Provider Notes (Signed)
CSN: [id number]     Arrival date & time 01/17/16  1312 History   First MD Initiated Contact with Patient 01/17/16 1344     Chief Complaint  Patient presents with  . Cough      HPI Comments: Patient developed flu symptoms six days ago, and a rapid flu test revealed Influenza B.  He was unable to afford Tamiflu, and has been treating himself symptomatically.  He complains of persistent non-productive cough, worse at night, and often coughs until he gags.  He has had bilateral inguinal hernia repairs, and has pain over the surgical scar area when he coughs.  He believes that he may have had chills, and remains fatigued with sore throat and myalgias.   He has a family history of asthma (father).  The history is provided by the patient and the spouse.    Past Medical History  Diagnosis Date  . Inguinal hernia     right  . Complication of anesthesia   . PONV (postoperative nausea and vomiting)   . Hematuria, microscopic     occ ,followed by urologist   Past Surgical History  Procedure Laterality Date  . Leg surgery      bilateral  . Wisdom tooth extraction    . Hernia repair  03/27/11    right  . Inguinal hernia repair Bilateral 01/04/2015    Procedure: LAPAROSCOPIC BILATERAL INGUINAL HERNIA REPAIR WITH MESH;  Surgeon: Erroll Luna, MD;  Location: [place];  Service: General;   Laterality: Bilateral;  . Insertion of mesh Bilateral 01/04/2015    Procedure: INSERTION OF MESH;  Surgeon: Erroll Luna, MD;  Location: [facility] OR;  Service: General;  Laterality: Bilateral;   Family History  Problem Relation Age of Onset  . Diabetes Father   . Hypertension Father    Social History  Substance Use Topics  . Smoking status: Never Smoker   . Smokeless tobacco: None  . Alcohol Use: Yes     Comment: beer occ    Review of Systems + sore throat + hoarse + cough No pleuritic pain but feels tight in anterior chest No wheezing + nasal congestion + post-nasal drainage No sinus pain/pressure No itchy/red eyes No earache No hemoptysis No SOB No fever, + chills + nausea + vomiting, resolved No abdominal pain No diarrhea No urinary symptoms No skin rash + fatigue + myalgias + headache Used OTC meds without relief  Allergies  Review of patient's allergies indicates no known allergies.  Home Medications   Prior to Admission medications   Medication Sig Start Date End Date Taking? Authorizing Provider  azithromycin (ZITHROMAX Z-PAK) 250 MG tablet Take 2 tabs today; then begin one tab once daily for 4 more days. 01/17/16   Kandra Nicolas, MD  guaiFENesin-codeine 100-10 MG/5ML syrup Take 34mL by mouth at bedtime as needed for cough 01/17/16   Kandra Nicolas, MD  predniSONE (DELTASONE) 20 MG tablet Take 1 tablet (20 mg total) by mouth 2 (two) times daily. Take with food. 01/17/16   Kandra Nicolas, MD   Meds Ordered and Administered this Visit  Medications - No data to display  BP 124/84 mmHg  Pulse 85  Temp(Src) 98.4 F (36.9 C) (Oral)  Resp 14  Wt 170 lb (77.111 kg)  SpO2 97% No data found.   Physical Exam Nursing notes and Vital Signs reviewed. Appearance:  Patient appears stated age, and in no acute distress Eyes:  Pupils are equal, round, and reactive to light and accomodation.  Extraocular movement is intact.  Conjunctivae are not inflamed  Ears:   Canals normal.  Tympanic membranes normal.  Nose:  Congested turbinates.  No sinus tenderness.   Pharynx:  Mild erythema Neck:  Supple.  Tender enlarged posterior nodes are palpated bilaterally  Lungs:  Clear to auscultation.  Breath sounds are equal.  Moving air well. Heart:  Regular rate and rhythm without murmurs, rubs, or gallops.  Abdomen:  Nontender without masses or hepatosplenomegaly.  Bowel sounds are present.  No CVA or flank tenderness.  Extremities:  No edema.  Skin:  No rash present.   ED Course  Procedures  None   Imaging Review Dg Chest 2 View  01/17/2016  CLINICAL DATA:  Patient states that he was diagnosed with the flu 6 days ago  and is still feeling awful and having fevers off/on, no other complaints EXAM: CHEST  2 VIEW COMPARISON:  None. FINDINGS: Midline trachea.  Normal heart size and mediastinal contours. Sharp costophrenic angles.  No pneumothorax.  Clear lungs. IMPRESSION: No active cardiopulmonary disease. Electronically Signed   By: Abigail Miyamoto M.D.   On: 01/17/2016 14:09      MDM   1. Influenza B with ?bronchospasm    Note family history of asthma father.  Will begin prednisone burst.  Because patient was unable to afford Tamiflu, will begin empiric Z-pak for bacterial coverage. Rx for Robitussin AC for night time cough.  Take plain guaifenesin (1200mg  extended release tabs such as Mucinex) twice daily, with plenty of water, for cough and congestion.  May add Pseudoephedrine (30mg , one or two every 4 to 6 hours) for sinus congestion.  Get adequate rest.   May use Afrin nasal spray (or generic oxymetazoline) twice daily for about 5 days and then discontinue.  Also recommend using saline nasal spray several times daily and saline nasal irrigation (AYR is a common brand).  Use Flonase nasal spray each morning after using Afrin nasal spray and saline nasal irrigation. Try warm salt water gargles for sore throat.  Stop all antihistamines for now, and other  non-prescription cough/cold preparations.  Follow-up with family doctor if not improving about one week.    Kandra Nicolas, MD 01/17/16 [phone]

## 2016-01-17 NOTE — ED Notes (Addendum)
Patient diagnosed with Influenza B 5 days ago. He did not take the Tamiflu prescribed due to cost. Fever resolved 2 days ago but still c/o cough, sore throat and nausea that is not improving. Negative rapid strep and strep culture done with Influenza diagnosis.

## 2016-03-31 ENCOUNTER — Encounter: Payer: Self-pay | Admitting: Emergency Medicine

## 2016-03-31 ENCOUNTER — Emergency Department (INDEPENDENT_AMBULATORY_CARE_PROVIDER_SITE_OTHER)
Admission: EM | Admit: 2016-03-31 | Discharge: 2016-03-31 | Disposition: A | Discharge status: Home / Self Care | Payer: BLUE CROSS/BLUE SHIELD | Source: Home / Self Care | Attending: Family Medicine | Admitting: Family Medicine

## 2016-03-31 ENCOUNTER — Emergency Department (INDEPENDENT_AMBULATORY_CARE_PROVIDER_SITE_OTHER): Payer: BLUE CROSS/BLUE SHIELD

## 2016-03-31 DIAGNOSIS — M222X1 Patellofemoral disorders, right knee: Secondary | ICD-10-CM

## 2016-03-31 DIAGNOSIS — M722 Plantar fascial fibromatosis: Secondary | ICD-10-CM | POA: Diagnosis not present

## 2016-03-31 DIAGNOSIS — M25561 Pain in right knee: Secondary | ICD-10-CM | POA: Diagnosis not present

## 2016-03-31 MED ORDER — MELOXICAM 15 MG PO TABS: 15.0000 mg | ORAL_TABLET | Freq: Every day | ORAL | Status: DC

## 2016-03-31 NOTE — ED Provider Notes (Signed)
CSN: [id number]     Arrival date & time 03/31/16  1158 History   First MD Initiated Contact with Patient 03/31/16 1348     Chief Complaint  Patient presents with  . Knee Pain      HPI Comments: Patient reports that during the past three weeks he has been wearing a right foot/ankle brace for plantar fasciitis.  Two weeks ago while applying his foot brace, he felt a popping sensation in his right knee.  He began wearing a right knee brace one week ago, but still has right knee pain.  His knee feels as if it may give way.  Patient is a 30 y.o. male presenting with knee pain. The history is provided by the patient.  Knee Pain Location:  Knee Time since incident:  2 weeks Injury: yes   Mechanism of injury comment:  While flexing his knee to apply a foot/ankle brace Knee location:  R knee Pain details:    Quality:  Aching   Radiates to:  Does not radiate   Severity:  Mild   Onset quality:  Sudden   Duration:  2 weeks   Timing:  Intermittent   Progression:  Unchanged Chronicity:  New Dislocation: no   Prior injury to area:  No Relieved by:  Nothing Worsened by:  Flexion and activity Ineffective treatments: knee brace. Associated symptoms: stiffness   Associated symptoms: no back pain, no decreased ROM, no muscle weakness, no numbness, no swelling and no tingling     Past Medical History  Diagnosis Date  . Inguinal hernia     right  . Complication of anesthesia   . PONV (postoperative nausea and vomiting)   . Hematuria, microscopic     occ ,followed by urologist   Past Surgical History  Procedure Laterality Date  . Leg surgery      bilateral  . Wisdom tooth extraction    . Hernia repair  03/27/11    right  . Inguinal hernia repair Bilateral 01/04/2015    Procedure: LAPAROSCOPIC BILATERAL INGUINAL HERNIA REPAIR WITH MESH;  Surgeon: Erroll Luna, MD;  Location: [place];  Service: General;  Laterality: Bilateral;  . Insertion of mesh Bilateral 01/04/2015    Procedure: INSERTION  OF MESH;  Surgeon: Erroll Luna, MD;  Location: [facility] OR;  Service: General;  Laterality: Bilateral;   Family History  Problem Relation Age of Onset  . Diabetes Father   . Hypertension Father    Social History  Substance Use Topics  . Smoking status: Never Smoker   . Smokeless tobacco: None  . Alcohol Use: Yes     Comment: beer occ    Review of Systems  Musculoskeletal: Positive for stiffness. Negative for back pain.  All other systems reviewed and are negative.   Allergies  Review of patient's allergies indicates no known allergies.  Home Medications   Prior to Admission medications   Medication Sig Start Date End Date Taking? Authorizing Provider  meloxicam (MOBIC) 15 MG tablet Take 1 tablet (15 mg total) by mouth daily. Take with food each morning 03/31/16   Kandra Nicolas, MD   Meds Ordered and Administered this Visit  Medications - No data to display  BP 130/87 mmHg  Pulse 69  Temp(Src) 98.2 F (36.8 C) (Oral)  Ht 5\' 7"  (1.702 m)  Wt 172 lb 4 oz (78.132 kg)  BMI 26.97 kg/m2  SpO2 99% No data found.   Physical Exam  Constitutional: He is oriented to person, place, and time.  He appears well-developed and well-nourished.  HENT:  Head: Normocephalic.  Eyes: Pupils are equal, round, and reactive to light.  Cardiovascular: Normal heart sounds.   Pulmonary/Chest: Breath sounds normal.  Musculoskeletal: He exhibits no edema.       Legs: Right knee:  No effusion, erythema, or warmth.  Knee stable, negative drawer test.  McMurray test negative.  There is tenderness to palpation right patellar superior pole. Right foot has tenderness to palpation plantar surface of calcaneus.  Neurological: He is alert and oriented to person, place, and time.  Skin: Skin is warm and dry. No rash noted.  Nursing note and vitals reviewed.   ED Course  Procedures none   Imaging Review Dg Knee Complete 4 Views Right  03/31/2016  CLINICAL DATA:  Pt states he has had right knee  pain x 2 weeks. Denies injury. Pain is anterior. EXAM: RIGHT KNEE - COMPLETE 4+ VIEW COMPARISON:  None. FINDINGS: No evidence of fracture, dislocation, or joint effusion. No evidence of arthropathy or other focal bone abnormality. Soft tissues are unremarkable. IMPRESSION: Negative. Electronically Signed   By: Lucrezia Europe M.D.   On: 03/31/2016 14:25      MDM   1. Patellofemoral syndrome, right   2. Plantar fasciitis of right foot    Begin Mobic 15mg  daily. Continue wearing right knee brace.  Begin range of motion and stretching exercises. Followup with Dr. Aundria Mems or Dr. Lynne Leader ([facility]) if not improving about two weeks.  Kandra Nicolas, MD 04/04/16 [phone]

## 2016-03-31 NOTE — Discharge Instructions (Signed)
Continue wearing right knee brace.  Begin range of motion and stretching exercises.   Plantar Fasciitis Plantar fasciitis is a painful foot condition that affects the heel. It occurs when the band of tissue that connects the toes to the heel bone (plantar fascia) becomes irritated. This can happen after exercising too much or doing other repetitive activities (overuse injury). The pain from plantar fasciitis can range from mild irritation to severe pain that makes it difficult for you to walk or move. The pain is usually worse in the morning or after you have been sitting or lying down for a while. CAUSES This condition may be caused by:  Standing for long periods of time.  Wearing shoes that do not fit.  Doing high-impact activities, including running, aerobics, and ballet.  Being overweight.  Having an abnormal way of walking (gait).  Having tight calf muscles.  Having high arches in your feet.  Starting a new athletic activity. SYMPTOMS The main symptom of this condition is heel pain. Other symptoms include:  Pain that gets worse after activity or exercise.  Pain that is worse in the morning or after resting.  Pain that goes away after you walk for a few minutes. DIAGNOSIS This condition may be diagnosed based on your signs and symptoms. Your health care provider will also do a physical exam to check for:  A tender area on the bottom of your foot.  A high arch in your foot.  Pain when you move your foot.  Difficulty moving your foot. You may also need to have imaging studies to confirm the diagnosis. These can include:  X-rays.  Ultrasound.  MRI. TREATMENT  Treatment for plantar fasciitis depends on the severity of the condition. Your treatment may include:  Rest, ice, and over-the-counter pain medicines to manage your pain.  Exercises to stretch your calves and your plantar fascia.  A splint that holds your foot in a stretched, upward position while you  sleep (night splint).  Physical therapy to relieve symptoms and prevent problems in the future.  Cortisone injections to relieve severe pain.  Extracorporeal shock wave therapy (ESWT) to stimulate damaged plantar fascia with electrical impulses. It is often used as a last resort before surgery.  Surgery, if other treatments have not worked after 12 months. HOME CARE INSTRUCTIONS  Take medicines only as directed by your health care provider.  Avoid activities that cause pain.  Roll the bottom of your foot over a bag of ice or a bottle of cold water. Do this for 20 minutes, 3-4 times a day.  Perform simple stretches as directed by your health care provider.  Try wearing athletic shoes with air-sole or gel-sole cushions or soft shoe inserts.  Wear a night splint while sleeping, if directed by your health care provider.  Keep all follow-up appointments with your health care provider. PREVENTION   Do not perform exercises or activities that cause heel pain.  Consider finding low-impact activities if you continue to have problems.  Lose weight if you need to. The best way to prevent plantar fasciitis is to avoid the activities that aggravate your plantar fascia. SEEK MEDICAL CARE IF:  Your symptoms do not go away after treatment with home care measures.  Your pain gets worse.  Your pain affects your ability to move or do your daily activities.   This information is not intended to replace advice given to you by your health care provider. Make sure you discuss any questions you have with your  health care provider.   Document Released: 07/17/2001 Document Revised: 07/13/2015 Document Reviewed: 09/01/2014 Elsevier Interactive Patient Education [employer].

## 2016-03-31 NOTE — ED Notes (Signed)
Pt c/o right knee pain x 2 weeks, no apparent injury.  It feels like a popping and grinding sensation.

## 2016-05-15 ENCOUNTER — Ambulatory Visit (INDEPENDENT_AMBULATORY_CARE_PROVIDER_SITE_OTHER): Payer: BLUE CROSS/BLUE SHIELD | Admitting: Family Medicine

## 2016-05-15 ENCOUNTER — Encounter: Payer: Self-pay | Admitting: Family Medicine

## 2016-05-15 VITALS — BP 125/81 | HR 84 | Ht 67.0 in | Wt 177.0 lb

## 2016-05-15 DIAGNOSIS — L918 Other hypertrophic disorders of the skin: Secondary | ICD-10-CM | POA: Diagnosis not present

## 2016-05-15 DIAGNOSIS — D229 Melanocytic nevi, unspecified: Secondary | ICD-10-CM | POA: Diagnosis not present

## 2016-05-15 DIAGNOSIS — M2550 Pain in unspecified joint: Secondary | ICD-10-CM

## 2016-05-15 MED ORDER — MELOXICAM 15 MG PO TABS: 15.0000 mg | ORAL_TABLET | Freq: Every day | ORAL | Status: DC | PRN

## 2016-05-15 NOTE — Patient Instructions (Signed)
Thank you for coming in today. Get fasting labs soon.  Take meloxicam as needed for pain.  If you back or knee pain worsens we can do formal PT which will help.   Patellofemoral Pain Syndrome Patellofemoral pain syndrome is a condition that involves a softening or breakdown of the tissue (cartilage) on the underside of your kneecap (patella). This causes pain in the front of the knee. The condition is also called runner's knee or chondromalacia patella. Patellofemoral pain syndrome is most common in young adults who are active in sports. Your knee is the largest joint in your body. The patella covers the front of your knee and is attached to muscles above and below your knee. The underside of the patella is covered with a smooth type of cartilage (synovium). The smooth surface helps the patella glide easily when you move your knee. Patellofemoral pain syndrome causes swelling in the joint linings and bone surfaces in your knee.  CAUSES  Patellofemoral pain syndrome can be caused by:  Overuse.  Poor alignment of your knee joints.  Weak leg muscles.  A direct blow to your kneecap. RISK FACTORS You may be at risk for patellofemoral pain syndrome if you:  Do a lot of activities that can wear down your kneecap. These include:  Running.  Squatting.  Climbing stairs.  Start a new physical activity or exercise program.  Wear shoes that do not fit well.  Do not have good leg strength.  Are overweight. SIGNS AND SYMPTOMS  Knee pain is the most common symptom of patellofemoral pain syndrome. This may feel like a dull, aching pain underneath your patella, in the front of your knee. There may be a popping or cracking sound when you move your knee. Pain may get worse with:  Exercise.  Climbing stairs.  Running.  Jumping.  Squatting.  Kneeling.  Sitting for a long time.  Moving or pushing on your patella. DIAGNOSIS  Your health care provider may be able to diagnose  patellofemoral pain syndrome from your symptoms and medical history. You may be asked about your recent physical activities and which ones cause knee pain. Your health care provider may do a physical exam with certain tests to confirm the diagnosis. These may include:  Moving your patella back and forth.  Checking your range of knee motion.  Having you squat or jump to see if you have pain.  Checking the strength of your leg muscles. An MRI of the knee may also be done. TREATMENT  Patellofemoral pain syndrome can usually be treated at home with rest, ice, compression, and elevation (RICE). Other treatments may include:  Nonsteroidal anti-inflammatory drugs (NSAIDs).  Physical therapy to stretch and strengthen your leg muscles.  Shoe inserts (orthotics) to take stress off your knee.  A knee brace or knee support.  Surgery to remove damaged cartilage or move the patella to a better position. The need for surgery is rare. HOME CARE INSTRUCTIONS   Take medicines only as directed by your health care provider.  Rest your knee.  When resting, keep your knee raised above the level of your heart.  Avoid activities that cause knee pain.  Apply ice to the injured area:  Put ice in a plastic bag.  Place a towel between your skin and the bag.  Leave the ice on for 20 minutes, 2-3 times a day.  Use splints, braces, knee supports, or walking aids as directed by your health care provider.  Perform stretching and strengthening exercises as  directed by your health care provider or physical therapist.  Keep all follow-up visits as directed by your health care provider. This is important. SEEK MEDICAL CARE IF:   Your symptoms get worse.  You are not improving with home care. MAKE SURE YOU:  Understand these instructions.  Will watch your condition.  Will get help right away if you are not doing well or get worse.   This information is not intended to replace advice given to you  by your health care provider. Make sure you discuss any questions you have with your health care provider.   Document Released: 10/10/2009 Document Revised: 11/12/2014 Document Reviewed: 01/11/2014 Elsevier Interactive Patient Education 2016 [place] Pain Joint pain, which is also called arthralgia, can be caused by many things. Joint pain often goes away when you follow your health care provider's instructions for relieving pain at home. However, joint pain can also be caused by conditions that require further treatment. Common causes of joint pain include:  Bruising in the area of the joint.  Overuse of the joint.  Wear and tear on the joints that occur with aging (osteoarthritis).  Various other forms of arthritis.  A buildup of a crystal form of uric acid in the joint (gout).  Infections of the joint (septic arthritis) or of the bone (osteomyelitis). Your health care provider may recommend medicine to help with the pain. If your joint pain continues, additional tests may be needed to diagnose your condition. HOME CARE INSTRUCTIONS Watch your condition for any changes. Follow these instructions as directed to lessen the pain that you are feeling.  Take medicines only as directed by your health care provider.  Rest the affected area for as long as your health care provider says that you should. If directed to do so, raise the painful joint above the level of your heart while you are sitting or lying down.  Do not do things that cause or worsen pain.  If directed, apply ice to the painful area:  Put ice in a plastic bag.  Place a towel between your skin and the bag.  Leave the ice on for 20 minutes, 2-3 times per day.  Wear an elastic bandage, splint, or sling as directed by your health care provider. Loosen the elastic bandage or splint if your fingers or toes become numb and tingle, or if they turn cold and blue.  Begin exercising or stretching the affected  area as directed by your health care provider. Ask your health care provider what types of exercise are safe for you.  Keep all follow-up visits as directed by your health care provider. This is important. SEEK MEDICAL CARE IF:  Your pain increases, and medicine does not help.  Your joint pain does not improve within 3 days.  You have increased bruising or swelling.  You have a fever.  You lose 10 lb (4.5 kg) or more without trying. SEEK IMMEDIATE MEDICAL CARE IF:  You are not able to move the joint.  Your fingers or toes become numb or they turn cold and blue.   This information is not intended to replace advice given to you by your health care provider. Make sure you discuss any questions you have with your health care provider.   Document Released: 10/22/2005 Document Revised: 11/12/2014 Document Reviewed: 08/03/2014 Elsevier Interactive Patient Education [employer].

## 2016-05-16 DIAGNOSIS — L918 Other hypertrophic disorders of the skin: Secondary | ICD-10-CM | POA: Insufficient documentation

## 2016-05-16 DIAGNOSIS — D229 Melanocytic nevi, unspecified: Secondary | ICD-10-CM | POA: Insufficient documentation

## 2016-05-16 NOTE — Progress Notes (Signed)
Roger Marquez is a 30 y.o. male who presents to [facility] today for multiple joint pain and moles.  Patient establishes care today.  He would like to discuss multiple joint pains. He has pain in both knees both wrists and occasional ankle. These pains come and go not currently bothering him at all. He does note some popping in his knees bilaterally as well but denies any locking or catching. Symptoms are mild but has been ongoing now for several years. He's had x-rays in the past which really didn't show much. He has also use meloxicam in the past which seems to help a moderate amount. He denies any specific injury to any of these joints. He notes a positive family history for rheumatoid arthritis but denies any personal diagnosis of any rheumatologic diagnoses. No fevers or chills nausea vomiting or diarrhea.  Additionally patient notes several moles and skin tags he would like addressed. He denies any history of any dysplastic nevi. He notes several skin tags or moles in his groin and one mole on his left arm he would like addressed. He notes these have changed in the last several years. He notes previously he's had several moles removed that resulted in keloid scars. He's reluctant to have a biopsy unless necessary.   Past Medical History  Diagnosis Date  . Inguinal hernia     right  . Complication of anesthesia   . PONV (postoperative nausea and vomiting)   . Hematuria, microscopic     occ ,followed by urologist   Past Surgical History  Procedure Laterality Date  . Leg surgery      bilateral  . Wisdom tooth extraction    . Hernia repair  03/27/11    right  . Inguinal hernia repair Bilateral 01/04/2015    Procedure: LAPAROSCOPIC BILATERAL INGUINAL HERNIA REPAIR WITH MESH;  Surgeon: Erroll Luna, MD;  Location: [facility];  Service: General;  Laterality: Bilateral;    . Insertion of mesh Bilateral 01/04/2015    Procedure: INSERTION OF MESH;  Surgeon: Erroll Luna, MD;  Location: [facility];  Service: General;  Laterality: Bilateral;   Social History  Substance Use Topics  . Smoking status: Never Smoker   . Smokeless tobacco: Not on file  . Alcohol Use: Yes     Comment: beer occ   family history includes Diabetes in his father; Hypertension in his father.  ROS as above: No headache, visual changes, nausea, vomiting, diarrhea, constipation, dizziness, abdominal pain, skin rash, fevers, chills, night sweats, weight loss, swollen lymph nodes, muscle aches, chest pain, shortness of breath, mood changes, visual or auditory hallucinations.     Medications: Current Outpatient Prescriptions  Medication Sig Dispense Refill  . meloxicam (MOBIC) 15 MG tablet Take 1 tablet (15 mg total) by mouth daily as needed for pain. 30 tablet 3   No current facility-administered medications for this visit.   No Known Allergies   Exam:  BP 125/81 mmHg  Pulse 84  Ht 5\' 7"  (1.702 m)  Wt 177 lb (80.287 kg)  BMI 27.72 kg/m2 Gen: Well NAD HEENT: EOMI,  MMM Lungs: Normal work of breathing. CTABL Heart: RRR no MRG Abd: NABS, Soft. Nondistended, Nontender Exts: Brisk capillary refill, warm and well perfused.  MSK: Wrists ankles and knees bilaterally are nontender with normal motion. Stable ligaments exam. No effusions. Skin: One small nevus left arm. Regular borders consistent color not raised nontender. Smaller then 5 mm. Several  skin tags in the groin. None are tender wart dysplastic appearing.  No results found for this or any previous visit (from the past 24 hour(s)). No results found.    Assessment and Plan: 30 y.o. male with  Arthralgia. Patient has occasional intermittent arthralgia. I'm doubtful for any isolated orthopedic injury. X-rays in the past have been normal. Rheumatologic workup and treatment with meloxicam. We will call patient with  results.  Additionally patient has one nevus and several skin tags. Plan for watchful waiting as patient has had keloid scars with previous biopsies.  Discussed warning signs or symptoms. Please see discharge instructions. Patient expresses understanding.

## 2016-05-17 LAB — CBC
HCT: 44.9 % (ref 38.5–50.0)
HEMOGLOBIN: 15.3 g/dL (ref 13.2–17.1)
MCH: 29.4 pg (ref 27.0–33.0)
MCHC: 34.1 g/dL (ref 32.0–36.0)
MCV: 86.3 fL (ref 80.0–100.0)
MPV: 9.3 fL (ref 7.5–12.5)
Platelets: 237 10*3/uL (ref 140–400)
RBC: 5.2 MIL/uL (ref 4.20–5.80)
RDW: 14.4 % (ref 11.0–15.0)
WBC: 6.8 10*3/uL (ref 3.8–10.8)

## 2016-05-17 LAB — TSH: TSH: 2.58 mIU/L (ref 0.40–4.50)

## 2016-05-17 LAB — HEMOGLOBIN A1C
Hgb A1c MFr Bld: 5.5 % (ref <5.7)
MEAN PLASMA GLUCOSE: 111 mg/dL

## 2016-05-18 LAB — RHEUMATOID FACTOR

## 2016-05-18 LAB — COMPREHENSIVE METABOLIC PANEL
ALT: 65 U/L — ABNORMAL HIGH (ref 9–46)
AST: 35 U/L (ref 10–40)
Albumin: 4.4 g/dL (ref 3.6–5.1)
Alkaline Phosphatase: 31 U/L — ABNORMAL LOW (ref 40–115)
BILIRUBIN TOTAL: 0.6 mg/dL (ref 0.2–1.2)
BUN: 17 mg/dL (ref 7–25)
CO2: 24 mmol/L (ref 20–31)
CREATININE: 0.97 mg/dL (ref 0.60–1.35)
Calcium: 8.8 mg/dL (ref 8.6–10.3)
Chloride: 104 mmol/L (ref 98–110)
Glucose, Bld: 93 mg/dL (ref 65–99)
Potassium: 4.3 mmol/L (ref 3.5–5.3)
SODIUM: 140 mmol/L (ref 135–146)
TOTAL PROTEIN: 6.7 g/dL (ref 6.1–8.1)

## 2016-05-18 LAB — CYCLIC CITRUL PEPTIDE ANTIBODY, IGG: Cyclic Citrullin Peptide Ab: <16 Units

## 2016-05-18 LAB — SEDIMENTATION RATE: Sed Rate: 1 mm/hr (ref 0–15)

## 2016-05-18 LAB — LIPID PANEL
CHOLESTEROL: 202 mg/dL — AB (ref 125–200)
HDL: 45 mg/dL (ref >=40)
LDL Cholesterol: 121 mg/dL (ref <130)
TRIGLYCERIDES: 182 mg/dL — AB (ref <150)
Total CHOL/HDL Ratio: 4.5 Ratio (ref <=5.0)
VLDL: 36 mg/dL — AB (ref <30)

## 2016-05-18 LAB — ANA: Anti Nuclear Antibody(ANA): NEGATIVE

## 2016-05-18 LAB — VITAMIN D 25 HYDROXY (VIT D DEFICIENCY, FRACTURES): Vit D, 25-Hydroxy: 23 ng/mL — ABNORMAL LOW (ref 30–100)

## 2016-05-18 LAB — URIC ACID: Uric Acid, Serum: 8 mg/dL (ref 4.0–8.0)

## 2016-05-19 ENCOUNTER — Encounter: Payer: Self-pay | Admitting: Family Medicine

## 2016-05-19 DIAGNOSIS — E559 Vitamin D deficiency, unspecified: Secondary | ICD-10-CM | POA: Insufficient documentation

## 2016-05-19 DIAGNOSIS — E790 Hyperuricemia without signs of inflammatory arthritis and tophaceous disease: Secondary | ICD-10-CM | POA: Insufficient documentation

## 2016-05-19 NOTE — Progress Notes (Signed)
Quick Note:  Vitamin D deficiency noted. Take 2000- 5000 units of vitamin D daily over-the-counter.  Uric acid is high. This can cause gout which can cause your pain. Return to clinic to discuss gout plan and options.  One labs is still pending.   ______

## 2016-05-23 LAB — HLA-B27 ANTIGEN: DNA Result:: NEGATIVE

## 2016-05-23 NOTE — Progress Notes (Signed)
Quick Note:  Final rheumatology lab came back negative ______

## 2016-05-31 ENCOUNTER — Ambulatory Visit (INDEPENDENT_AMBULATORY_CARE_PROVIDER_SITE_OTHER): Payer: BLUE CROSS/BLUE SHIELD | Admitting: Family Medicine

## 2016-05-31 VITALS — BP 150/84 | HR 75 | Wt 178.0 lb

## 2016-05-31 DIAGNOSIS — R7989 Other specified abnormal findings of blood chemistry: Secondary | ICD-10-CM | POA: Diagnosis not present

## 2016-05-31 DIAGNOSIS — E790 Hyperuricemia without signs of inflammatory arthritis and tophaceous disease: Secondary | ICD-10-CM

## 2016-05-31 MED ORDER — COLCHICINE 0.6 MG PO TABS: 0.6000 mg | ORAL_TABLET | Freq: Every day | ORAL | 2 refills | Status: DC

## 2016-05-31 MED ORDER — COLCHICINE 0.6 MG PO CAPS: 1.0000 | ORAL_CAPSULE | Freq: Every day | ORAL | 2 refills | Status: DC

## 2016-05-31 MED ORDER — ALLOPURINOL 100 MG PO TABS: 100.0000 mg | ORAL_TABLET | Freq: Every day | ORAL | 1 refills | Status: DC

## 2016-05-31 NOTE — Progress Notes (Signed)
       Roger Marquez is a 30 y.o. male who presents to [facility] today for follow-up recent lab findings. Patient was seen originally for diffuse aches and pains. He had a limited rheumatologic workup that showed low vitamin D and elevated uric acid. He still notes occasional aches and pains and uses meloxicam intermittently. No fevers or chills.   Past Medical History:  Diagnosis Date  . Complication of anesthesia   . Hematuria, microscopic    occ ,followed by urologist  . Inguinal hernia    right  . PONV (postoperative nausea and vomiting)    Past Surgical History:  Procedure Laterality Date  . HERNIA REPAIR  03/27/11   right  . INGUINAL HERNIA REPAIR Bilateral 01/04/2015   Procedure: LAPAROSCOPIC BILATERAL INGUINAL HERNIA REPAIR WITH MESH;  Surgeon: Erroll Luna, MD;  Location: [facility];  Service: General;  Laterality: Bilateral;  . INSERTION OF MESH Bilateral 01/04/2015   Procedure: INSERTION OF MESH;  Surgeon: Erroll Luna, MD;  Location: [facility];  Service: General;  Laterality: Bilateral;  . LEG SURGERY     bilateral  . WISDOM TOOTH EXTRACTION     Social History  Substance Use Topics  . Smoking status: Never Smoker  . Smokeless tobacco: Not on file  . Alcohol use Yes     Comment: beer occ   family history includes Diabetes in his father; Hypertension in his father.  ROS as above:  Medications: Current Outpatient Prescriptions  Medication Sig Dispense Refill  . meloxicam (MOBIC) 15 MG tablet Take 1 tablet (15 mg total) by mouth daily as needed for pain. 30 tablet 3  . allopurinol (ZYLOPRIM) 100 MG tablet Take 1 tablet (100 mg total) by mouth daily. 90 tablet 1  . Colchicine 0.6 MG CAPS Take 1 capsule by mouth daily. 30 capsule 2  . colchicine 0.6 MG tablet Take 1 tablet (0.6 mg total) by mouth daily. 30 tablet 2   No current facility-administered  medications for this visit.    No Known Allergies   Exam:  BP (!) 150/84   Pulse 75   Wt 178 lb (80.7 kg)   BMI 27.88 kg/m  Gen: Well NAD MSK: No obvious joint effusions. Normal motion. Nontender joints throughout. Normal gait.  Lab Results  Component Value Date   LABURIC 8.0 05/15/2016     No results found for this or any previous visit (from the past 24 hour(s)). No results found.    Assessment and Plan: 30 y.o. male with He elevated uric acid very likely chronic gout. This is likely the explanation for her significant amount of his joint pains.  Plan to start allopurinol and colchicine. Recheck in a few months.   No orders of the defined types were placed in this encounter.   Discussed warning signs or symptoms. Please see discharge instructions. Patient expresses understanding.

## 2016-05-31 NOTE — Patient Instructions (Signed)
Thank you for coming in today. Start allopurinol daily. Take whichever colchicine is cheapest. Recheck in 3 months.  Low-Purine Diet Purines are compounds that affect the level of uric acid in your body. A low-purine diet is a diet that is low in purines. Eating a low-purine diet can prevent the level of uric acid in your body from getting too high and causing gout or kidney stones or both. WHAT DO I NEED TO KNOW ABOUT THIS DIET?  Choose low-purine foods. Examples of low-purine foods are listed in the next section.  Drink plenty of fluids, especially water. Fluids can help remove uric acid from your body. Try to drink 8-16 cups (1.9-3.8 L) a day.  Limit foods high in fat, especially saturated fat, as fat makes it harder for the body to get rid of uric acid. Foods high in saturated fat include pizza, cheese, ice cream, whole milk, fried foods, and gravies. Choose foods that are lower in fat and lean sources of protein. Use olive oil when cooking as it contains healthy fats that are not high in saturated fat.  Limit alcohol. Alcohol interferes with the elimination of uric acid from your body. If you are having a gout attack, avoid all alcohol.  Keep in mind that different people's bodies react differently to different foods. You will probably learn over time which foods do or do not affect you. If you discover that a food tends to cause your gout to flare up, avoid eating that food. You can more freely enjoy foods that do not cause problems. If you have any questions about a food item, talk to your dietitian or health care provider. WHICH FOODS ARE LOW, MODERATE, AND HIGH IN PURINES? The following is a list of foods that are low, moderate, and high in purines. You can eat any amount of the foods that are low in purines. You may be able to have small amounts of foods that are moderate in purines. Ask your health care provider how much of a food moderate in purines you can have. Avoid foods high in  purines. Grains  Foods low in purines: Enriched white bread, pasta, rice, cake, cornbread, popcorn.  Foods moderate in purines: Whole-grain breads and cereals, wheat germ, bran, oatmeal. Uncooked oatmeal. Dry wheat bran or wheat germ.  Foods high in purines: Pancakes, [place] toast, biscuits, muffins. Vegetables  Foods low in purines: All vegetables, except those that are moderate in purines.  Foods moderate in purines: Asparagus, cauliflower, spinach, mushrooms, green peas. Fruits  All fruits are low in purines. Meats and other Protein Foods  Foods low in purines: Eggs, nuts, peanut butter.  Foods moderate in purines: 80-90% lean beef, lamb, veal, pork, poultry, fish, eggs, peanut butter, nuts. Crab, lobster, oysters, and shrimp. Cooked dried beans, peas, and lentils.  Foods high in purines: Anchovies, sardines, herring, mussels, tuna, codfish, scallops, trout, and haddock. Berniece Salines. Organ meats (such as liver or kidney). Tripe. Game meat. Goose. Sweetbreads. Dairy  All dairy foods are low in purines. Low-fat and fat-free dairy products are best because they are low in saturated fat. Beverages  Drinks low in purines: Water, carbonated beverages, tea, coffee, cocoa.  Drinks moderate in purines: Soft drinks and other drinks sweetened with high-fructose corn syrup. Juices. To find whether a food or drink is sweetened with high-fructose corn syrup, look at the ingredients list.  Drinks high in purines: Alcoholic beverages (such as beer). Condiments  Foods low in purines: Salt, herbs, olives, pickles, relishes, vinegar.  Foods  moderate in purines: Butter, margarine, oils, mayonnaise. Fats and Oils  Foods low in purines: All types, except gravies and sauces made with meat.  Foods high in purines: Gravies and sauces made with meat. Other Foods  Foods low in purines: Sugars, sweets, gelatin. Cake. Soups made without meat.  Foods moderate in purines: Meat-based or fish-based soups,  broths, or bouillons. Foods and drinks sweetened with high-fructose corn syrup.  Foods high in purines: High-fat desserts (such as ice cream, cookies, cakes, pies, doughnuts, and chocolate). Contact your dietitian for more information on foods that are not listed here.   This information is not intended to replace advice given to you by your health care provider. Make sure you discuss any questions you have with your health care provider.   Document Released: 02/16/2011 Document Revised: 10/27/2013 Document Reviewed: 09/28/2013 Elsevier Interactive Patient Education 2016 [employer].    Gout Gout is an inflammatory arthritis caused by a buildup of uric acid crystals in the joints. Uric acid is a chemical that is normally present in the blood. When the level of uric acid in the blood is too high it can form crystals that deposit in your joints and tissues. This causes joint redness, soreness, and swelling (inflammation). Repeat attacks are common. Over time, uric acid crystals can form into masses (tophi) near a joint, destroying bone and causing disfigurement. Gout is treatable and often preventable. CAUSES  The disease begins with elevated levels of uric acid in the blood. Uric acid is produced by your body when it breaks down a naturally found substance called purines. Certain foods you eat, such as meats and fish, contain high amounts of purines. Causes of an elevated uric acid level include:  Being passed down from parent to child (heredity).  Diseases that cause increased uric acid production (such as obesity, psoriasis, and certain cancers).  Excessive alcohol use.  Diet, especially diets rich in meat and seafood.  Medicines, including certain cancer-fighting medicines (chemotherapy), water pills (diuretics), and aspirin.  Chronic kidney disease. The kidneys are no longer able to remove uric acid well.  Problems with metabolism. Conditions strongly associated with gout  include:  Obesity.  High blood pressure.  High cholesterol.  Diabetes. Not everyone with elevated uric acid levels gets gout. It is not understood why some people get gout and others do not. Surgery, joint injury, and eating too much of certain foods are some of the factors that can lead to gout attacks. SYMPTOMS   An attack of gout comes on quickly. It causes intense pain with redness, swelling, and warmth in a joint.  Fever can occur.  Often, only one joint is involved. Certain joints are more commonly involved:  Base of the big toe.  Knee.  Ankle.  Wrist.  Finger. Without treatment, an attack usually goes away in a few days to weeks. Between attacks, you usually will not have symptoms, which is different from many other forms of arthritis. DIAGNOSIS  Your caregiver will suspect gout based on your symptoms and exam. In some cases, tests may be recommended. The tests may include:  Blood tests.  Urine tests.  X-rays.  Joint fluid exam. This exam requires a needle to remove fluid from the joint (arthrocentesis). Using a microscope, gout is confirmed when uric acid crystals are seen in the joint fluid. TREATMENT  There are two phases to gout treatment: treating the sudden onset (acute) attack and preventing attacks (prophylaxis).  Treatment of an Acute Attack.  Medicines are used. These  include anti-inflammatory medicines or steroid medicines.  An injection of steroid medicine into the affected joint is sometimes necessary.  The painful joint is rested. Movement can worsen the arthritis.  You may use warm or cold treatments on painful joints, depending which works best for you.  Treatment to Prevent Attacks.  If you suffer from frequent gout attacks, your caregiver may advise preventive medicine. These medicines are started after the acute attack subsides. These medicines either help your kidneys eliminate uric acid from your body or decrease your uric acid  production. You may need to stay on these medicines for a very long time.  The early phase of treatment with preventive medicine can be associated with an increase in acute gout attacks. For this reason, during the first few months of treatment, your caregiver may also advise you to take medicines usually used for acute gout treatment. Be sure you understand your caregiver's directions. Your caregiver may make several adjustments to your medicine dose before these medicines are effective.  Discuss dietary treatment with your caregiver or dietitian. Alcohol and drinks high in sugar and fructose and foods such as meat, poultry, and seafood can increase uric acid levels. Your caregiver or dietitian can advise you on drinks and foods that should be limited. HOME CARE INSTRUCTIONS   Do not take aspirin to relieve pain. This raises uric acid levels.  Only take over-the-counter or prescription medicines for pain, discomfort, or fever as directed by your caregiver.  Rest the joint as much as possible. When in bed, keep sheets and blankets off painful areas.  Keep the affected joint raised (elevated).  Apply warm or cold treatments to painful joints. Use of warm or cold treatments depends on which works best for you.  Use crutches if the painful joint is in your leg.  Drink enough fluids to keep your urine clear or pale yellow. This helps your body get rid of uric acid. Limit alcohol, sugary drinks, and fructose drinks.  Follow your dietary instructions. Pay careful attention to the amount of protein you eat. Your daily diet should emphasize fruits, vegetables, whole grains, and fat-free or low-fat milk products. Discuss the use of coffee, vitamin C, and cherries with your caregiver or dietitian. These may be helpful in lowering uric acid levels.  Maintain a healthy body weight. SEEK MEDICAL CARE IF:   You develop diarrhea, vomiting, or any side effects from medicines.  You do not feel better in  24 hours, or you are getting worse. SEEK IMMEDIATE MEDICAL CARE IF:   Your joint becomes suddenly more tender, and you have chills or a fever. MAKE SURE YOU:   Understand these instructions.  Will watch your condition.  Will get help right away if you are not doing well or get worse.   This information is not intended to replace advice given to you by your health care provider. Make sure you discuss any questions you have with your health care provider.   Document Released: 10/19/2000 Document Revised: 11/12/2014 Document Reviewed: 06/04/2012 Elsevier Interactive Patient Education [employer].

## 2016-06-16 ENCOUNTER — Emergency Department (INDEPENDENT_AMBULATORY_CARE_PROVIDER_SITE_OTHER)
Admission: EM | Admit: 2016-06-16 | Discharge: 2016-06-16 | Disposition: A | Discharge status: Home / Self Care | Payer: BLUE CROSS/BLUE SHIELD | Source: Home / Self Care

## 2016-06-16 ENCOUNTER — Encounter: Payer: Self-pay | Admitting: Emergency Medicine

## 2016-06-16 DIAGNOSIS — J0100 Acute maxillary sinusitis, unspecified: Secondary | ICD-10-CM

## 2016-06-16 MED ORDER — AMOXICILLIN 500 MG PO CAPS: 500.0000 mg | ORAL_CAPSULE | Freq: Three times a day (TID) | ORAL | 0 refills | Status: DC

## 2016-06-16 NOTE — ED Provider Notes (Signed)
CSN: [id number]     Arrival date & time 06/16/16  1013 History   None    Chief Complaint  Patient presents with  . Sore Throat   (Consider location/radiation/quality/duration/timing/severity/associated sxs/prior Treatment) The history is provided by the patient. No language interpreter was used.  Sore Throat  This is a new problem. The current episode started more than 2 days ago. The problem occurs constantly. The problem has been gradually worsening. Pertinent negatives include no shortness of breath. Nothing aggravates the symptoms. Nothing relieves the symptoms. He has tried nothing for the symptoms. The treatment provided no relief.  Pt complains of a sore throat and sinus drainage  Past Medical History:  Diagnosis Date  . Complication of anesthesia   . Hematuria, microscopic    occ ,followed by urologist  . Inguinal hernia    right  . PONV (postoperative nausea and vomiting)    Past Surgical History:  Procedure Laterality Date  . HERNIA REPAIR  03/27/11   right  . INGUINAL HERNIA REPAIR Bilateral 01/04/2015   Procedure: LAPAROSCOPIC BILATERAL INGUINAL HERNIA REPAIR WITH MESH;  Surgeon: Erroll Luna, MD;  Location: [facility];  Service: General;  Laterality: Bilateral;  . INSERTION OF MESH Bilateral 01/04/2015   Procedure: INSERTION OF MESH;  Surgeon: Erroll Luna, MD;  Location: [facility];  Service: General;  Laterality: Bilateral;  . LEG SURGERY     bilateral  . WISDOM TOOTH EXTRACTION     Family History  Problem Relation Age of Onset  . Diabetes Father   . Hypertension Father    Social History  Substance Use Topics  . Smoking status: Never Smoker  . Smokeless tobacco: Never Used  . Alcohol use Yes     Comment: beer occ    Review of Systems  Respiratory: Negative for shortness of breath.   All other systems reviewed and are negative.   Allergies  Review of patient's allergies indicates no known allergies.  Home Medications   Prior to Admission medications    Medication Sig Start Date End Date Taking? Authorizing Provider  allopurinol (ZYLOPRIM) 100 MG tablet Take 1 tablet (100 mg total) by mouth daily. 05/31/16   Gregor Hams, MD  amoxicillin (AMOXIL) 500 MG capsule Take 1 capsule (500 mg total) by mouth 3 (three) times daily. 06/16/16   Fransico Meadow, PA-C  Colchicine 0.6 MG CAPS Take 1 capsule by mouth daily. 05/31/16   Gregor Hams, MD  colchicine 0.6 MG tablet Take 1 tablet (0.6 mg total) by mouth daily. 05/31/16   Gregor Hams, MD  meloxicam (MOBIC) 15 MG tablet Take 1 tablet (15 mg total) by mouth daily as needed for pain. 05/15/16   Gregor Hams, MD   Meds Ordered and Administered this Visit  Medications - No data to display  BP 134/87 (BP Location: Right Arm)   Pulse 74   Temp 97.8 F (36.6 C) (Oral)   SpO2 98%  No data found.   Physical Exam  Constitutional: He appears well-developed and well-nourished.  HENT:  Head: Normocephalic and atraumatic.  Right Ear: External ear normal.  Left Ear: External ear normal.  Nose: Nose normal.  Erythema throat, tender maxillary sinuses  Eyes: Conjunctivae are normal.  Neck: Neck supple.  Cardiovascular: Normal rate and regular rhythm.   No murmur heard. Pulmonary/Chest: Effort normal and breath sounds normal. No respiratory distress.  Abdominal: Soft. There is no tenderness.  Musculoskeletal: He exhibits no edema.  Neurological: He is alert.  Skin: Skin  is warm and dry.  Psychiatric: He has a normal mood and affect.  Nursing note and vitals reviewed.   Urgent Care Course   Clinical Course    Procedures (including critical care time)  Labs Review Labs Reviewed - No data to display  Imaging Review No results found.   Visual Acuity Review  Right Eye Distance:   Left Eye Distance:   Bilateral Distance:    Right Eye Near:   Left Eye Near:    Bilateral Near:         MDM   1. Acute maxillary sinusitis, recurrence not specified    Meds ordered this encounter   Medications  . amoxicillin (AMOXIL) 500 MG capsule    Sig: Take 1 capsule (500 mg total) by mouth 3 (three) times daily.    Dispense:  30 capsule    Refill:  0    Order Specific Question:   Supervising Provider    Answer:   Burnett Harry, DAVID [[id number]]  An After Visit Summary was printed and given to the patient.    [place], PA-C 06/16/16 1048

## 2016-06-16 NOTE — ED Triage Notes (Signed)
Pt c/o sore throat and sinus pressure x2 days. Denies fever.

## 2016-06-24 ENCOUNTER — Emergency Department (INDEPENDENT_AMBULATORY_CARE_PROVIDER_SITE_OTHER): Payer: BLUE CROSS/BLUE SHIELD

## 2016-06-24 ENCOUNTER — Encounter: Payer: Self-pay | Admitting: Emergency Medicine

## 2016-06-24 ENCOUNTER — Emergency Department (INDEPENDENT_AMBULATORY_CARE_PROVIDER_SITE_OTHER)
Admission: EM | Admit: 2016-06-24 | Discharge: 2016-06-24 | Disposition: A | Discharge status: Home / Self Care | Payer: BLUE CROSS/BLUE SHIELD | Source: Home / Self Care | Attending: Family Medicine | Admitting: Family Medicine

## 2016-06-24 DIAGNOSIS — J0101 Acute recurrent maxillary sinusitis: Secondary | ICD-10-CM | POA: Diagnosis not present

## 2016-06-24 DIAGNOSIS — R51 Headache: Secondary | ICD-10-CM

## 2016-06-24 MED ORDER — PREDNISONE 20 MG PO TABS: ORAL_TABLET | ORAL | 0 refills | Status: DC

## 2016-06-24 MED ORDER — AZITHROMYCIN 250 MG PO TABS: ORAL_TABLET | ORAL | 0 refills | Status: DC

## 2016-06-24 NOTE — ED Triage Notes (Signed)
Pt states that he was seen about a week ago, dx w/sinus infection, given Amoxil, now having nausea,vomitting and head congestion.

## 2016-06-24 NOTE — Discharge Instructions (Signed)
Continue Pseudoephedrine for sinus congestion.  Get adequate rest.   May use Afrin nasal spray (or generic oxymetazoline) twice daily for about 5 days and then discontinue.  Also recommend using saline nasal spray several times daily and saline nasal irrigation (AYR is a common brand).  Use Flonase nasal spray each morning after using Afrin nasal spray and saline nasal irrigation. Stop all antihistamines for now, and other non-prescription cough/cold preparations.

## 2016-06-24 NOTE — ED Provider Notes (Signed)
[facility]    CSN: [id number] Arrival date & time: 06/24/16  1538  First Provider Contact:  First MD Initiated Contact with Patient 06/24/16 1636        History   Chief Complaint Chief Complaint  Patient presents with  . Sinusitis    HPI Roger Marquez is a 30 y.o. male.   Patient reports that he was treated for sinusitis with amoxicillin one week ago and improved somewhat with resolution of a sore throat.  However, he continues to have a daily frontal headache that has been present for about a month.  The headache usually begins about mid-day, and improves after taking pseudoephedrine.  He denies fevers, chills, and sweats.  No localizing neurologic symptoms.      Past Medical History:  Diagnosis Date  . Complication of anesthesia   . Hematuria, microscopic    occ ,followed by urologist  . Inguinal hernia    right  . PONV (postoperative nausea and vomiting)     Patient Active Problem List   Diagnosis Date Noted  . Vitamin D deficiency 05/19/2016  . Elevated uric acid in blood 05/19/2016  . Benign mole 05/16/2016  . Skin tag 05/16/2016  . Arthralgia 05/15/2016  . Inguinodynia 12/27/2011  . INGUINAL HERNIA, RIGHT 01/30/2011    Past Surgical History:  Procedure Laterality Date  . HERNIA REPAIR  03/27/11   right  . INGUINAL HERNIA REPAIR Bilateral 01/04/2015   Procedure: LAPAROSCOPIC BILATERAL INGUINAL HERNIA REPAIR WITH MESH;  Surgeon: Erroll Luna, MD;  Location: [facility];  Service: General;  Laterality: Bilateral;  . INSERTION OF MESH Bilateral 01/04/2015   Procedure: INSERTION OF MESH;  Surgeon: Erroll Luna, MD;  Location: [facility];  Service: General;  Laterality: Bilateral;  . LEG SURGERY     bilateral  . WISDOM TOOTH EXTRACTION         Home Medications    Prior to Admission medications   Medication Sig Start Date End Date Taking? Authorizing Provider  allopurinol (ZYLOPRIM) 100 MG tablet Take 1 tablet (100 mg total) by mouth daily.  05/31/16   Gregor Hams, MD  azithromycin (ZITHROMAX Z-PAK) 250 MG tablet Take 2 tabs today; then begin one tab once daily for 4 more days. 06/24/16   Kandra Nicolas, MD  Colchicine 0.6 MG CAPS Take 1 capsule by mouth daily. 05/31/16   Gregor Hams, MD  colchicine 0.6 MG tablet Take 1 tablet (0.6 mg total) by mouth daily. 05/31/16   Gregor Hams, MD  meloxicam (MOBIC) 15 MG tablet Take 1 tablet (15 mg total) by mouth daily as needed for pain. 05/15/16   Gregor Hams, MD  predniSONE (DELTASONE) 20 MG tablet Take one tab by mouth twice daily for 5 days, then one daily. Take with food. 06/24/16   Kandra Nicolas, MD    Family History Family History  Problem Relation Age of Onset  . Diabetes Father   . Hypertension Father     Social History Social History  Substance Use Topics  . Smoking status: Never Smoker  . Smokeless tobacco: Never Used  . Alcohol use Yes     Comment: beer occ     Allergies   Review of patient's allergies indicates no known allergies.   Review of Systems Review of Systems + sore throat, resolved No cough No pleuritic pain No wheezing + nasal congestion + post-nasal drainage + sinus pain/pressure No itchy/red eyes No earache No hemoptysis No SOB No fever/chills No  nausea No vomiting No abdominal pain No diarrhea No urinary symptoms No skin rash + fatigue No myalgias + headache    Physical Exam Triage Vital Signs ED Triage Vitals  Enc Vitals Group     BP 06/24/16 1546 134/85     Pulse Rate 06/24/16 1546 72     Resp --      Temp 06/24/16 1546 98.1 F (36.7 C)     Temp Source 06/24/16 1546 Oral     SpO2 06/24/16 1546 98 %     Weight 06/24/16 1547 175 lb 8 oz (79.6 kg)     Height 06/24/16 1547 5\' 7"  (1.702 m)     Head Circumference --      Peak Flow --      Pain Score 06/24/16 1548 8     Pain Loc --      Pain Edu? --      Excl. in [place]? --    No data found.   Updated Vital Signs BP 134/85 (BP Location: Left Arm)   Pulse 72   Temp  98.1 F (36.7 C) (Oral)   Ht 5\' 7"  (1.702 m)   Wt 175 lb 8 oz (79.6 kg)   SpO2 98%   BMI 27.49 kg/m   Visual Acuity Right Eye Distance:   Left Eye Distance:   Bilateral Distance:    Right Eye Near:   Left Eye Near:    Bilateral Near:     Physical Exam Nursing notes and Vital Signs reviewed. Appearance:  Patient appears stated age, and in no acute distress Eyes:  Pupils are equal, round, and reactive to light and accomodation.  Extraocular movement is intact.  Conjunctivae are not inflamed  Ears:  Canals normal.  Tympanic membranes normal.  Nose:  Congested turbinates.  Frontal and left maxillary sinus tenderness is present.  Pharynx:  Normal Neck:  Supple. No adenopathy.  Lungs:  Clear to auscultation.  Breath sounds are equal.  Moving air well. Heart:  Regular rate and rhythm without murmurs, rubs, or gallops.  Abdomen:  Nontender without masses or hepatosplenomegaly.  Bowel sounds are present.  No CVA or flank tenderness.  Extremities:  No edema.  Skin:  No rash present.    UC Treatments / Results  Labs (all labs ordered are listed, but only abnormal results are displayed) Labs Reviewed - No data to display  EKG  EKG Interpretation None       Radiology Dg Sinuses Complete  Result Date: 06/24/2016 CLINICAL DATA:  Headaches with left facial pressure for 3 weeks. EXAM: PARANASAL SINUSES - COMPLETE 3 + VIEW COMPARISON:  None. FINDINGS: Limited collimation. The left maxillary sinus appears opacified without definite air-fluid levels. The additional paranasal sinuses appear clear. No foreign bodies are seen. The calvarium appears intact. IMPRESSION: Opacified left maxillary sinus suggesting sinusitis, of uncertain acuity. Consider follow-up CT if the patient's symptoms fail to resolve after appropriate conservative therapy. Electronically Signed   By: Richardean Sale M.D.   On: 06/24/2016 17:17    Procedures Procedures (including critical care time)  Medications  Ordered in UC Medications - No data to display   Initial Impression / Assessment and Plan / UC Course  I have reviewed the triage vital signs and the nursing notes.  Pertinent labs & imaging results that were available during my care of the patient were reviewed by me and considered in my medical decision making (see chart for details).  Clinical Course   Begin Z-pak and prednisone burst/taper.  Continue Pseudoephedrine for sinus congestion.  Get adequate rest.   May use Afrin nasal spray (or generic oxymetazoline) twice daily for about 5 days and then discontinue.  Also recommend using saline nasal spray several times daily and saline nasal irrigation (AYR is a common brand).  Use Flonase nasal spray each morning after using Afrin nasal spray and saline nasal irrigation. Stop all antihistamines for now, and other non-prescription cough/cold preparations. Followup with ENT if not improved 2 weeks.      Final Clinical Impressions(s) / UC Diagnoses   Final diagnoses:  Recurrent maxillary sinusitis, unspecified chronicity    New Prescriptions Discharge Medication List as of 06/24/2016  5:34 PM    START taking these medications   Details  azithromycin (ZITHROMAX Z-PAK) 250 MG tablet Take 2 tabs today; then begin one tab once daily for 4 more days., Print    predniSONE (DELTASONE) 20 MG tablet Take one tab by mouth twice daily for 5 days, then one daily. Take with food., Print         Kandra Nicolas, MD 06/26/16 [phone]

## 2016-08-13 ENCOUNTER — Ambulatory Visit (INDEPENDENT_AMBULATORY_CARE_PROVIDER_SITE_OTHER): Payer: BLUE CROSS/BLUE SHIELD | Admitting: Family Medicine

## 2016-08-13 ENCOUNTER — Encounter: Payer: Self-pay | Admitting: Family Medicine

## 2016-08-13 VITALS — BP 138/79 | HR 89 | Wt 177.0 lb

## 2016-08-13 DIAGNOSIS — G8929 Other chronic pain: Secondary | ICD-10-CM | POA: Diagnosis not present

## 2016-08-13 DIAGNOSIS — E790 Hyperuricemia without signs of inflammatory arthritis and tophaceous disease: Secondary | ICD-10-CM | POA: Diagnosis not present

## 2016-08-13 DIAGNOSIS — M545 Low back pain, unspecified: Secondary | ICD-10-CM | POA: Insufficient documentation

## 2016-08-13 DIAGNOSIS — M25539 Pain in unspecified wrist: Secondary | ICD-10-CM | POA: Diagnosis not present

## 2016-08-13 MED ORDER — FEBUXOSTAT 40 MG PO TABS: 40.0000 mg | ORAL_TABLET | Freq: Every day | ORAL | 1 refills | Status: DC

## 2016-08-13 MED ORDER — OMEPRAZOLE 40 MG PO CPDR: 40.0000 mg | DELAYED_RELEASE_CAPSULE | Freq: Every day | ORAL | 3 refills | Status: DC

## 2016-08-13 NOTE — Patient Instructions (Signed)
Thank you for coming in today. Return in 4-6 weeks.  Attend PT.  Start Uloric and Omeprazole.   Febuxostat oral tablets What is this medicine? FEBUXOSTAT (feb UX oh stat) is used to treat gout. People with gout have too much uric acid in their body. This medicine works to lower how much uric acid the body makes. This medicine may be used for other purposes; ask your health care provider or pharmacist if you have questions. What should I tell my health care provider before I take this medicine? They need to know if you have any of these conditions: -heart disease -history of stroke -kidney disease -liver disease - taking theophylline, azathioprine, or mercaptopurine - an unusual or allergic reaction to febuxostat, other medicines, foods, dyes, or preservatives - pregnant or trying to get pregnant - breast feeding How should I use this medicine? Take this medicine by mouth with a glass of water. Follow the directions on the prescription label. You can take it with or without food. Take your medicine at regular intervals. Do not take it more often than directed. Do not stop taking except on your doctor's advice. Talk to your pediatrician regarding the use of this medicine in children. This medicine is not approved for use in children. Overdosage: If you think you have taken too much of this medicine contact a poison control center or emergency room at once. NOTE: This medicine is only for you. Do not share this medicine with others. What if I miss a dose? If you miss a dose, take it as soon as you can. If it is almost time for your next dose, take only that dose. Do not take double or extra doses. What may interact with this medicine? Do not take this medicine with any of the following medications: -azathioprine -mercaptopurine -theophylline This medicine may also interact with the following medications: -certain medicines used to treat gout -medicines used to treat  cancer -rasburicase This list may not describe all possible interactions. Give your health care provider a list of all the medicines, herbs, non-prescription drugs, or dietary supplements you use. Also tell them if you smoke, drink alcohol, or use illegal drugs. Some items may interact with your medicine. What should I watch for while using this medicine? Visit your doctor or health care professional for regular checks on your progress. You will need regular blood tests while you are taking this medicine. Your gout may flare up while you are taking this medicine. If you have gout pain, do not stop taking this medicine. You may need to take another medicine with this medicine for gout pain. What side effects may I notice from receiving this medicine? Side effects that you should report to your doctor or health care professional as soon as possible: -allergic reactions like skin rash, itching or hives, swelling of the face, lips, or tongue -breathing problems -changes in vision -chest pain -confusion, trouble speaking or understanding -dizziness -fast, irregular heartbeat -feeling faint or lightheaded, falls -gout pain -muscle aches or pains -severe headaches -sudden numbness or weakness of the face, arm or leg -trouble walking, dizziness, loss of balance or coordination -unusually weak or tired -yellowing of the eyes or skin Side effects that usually do not require medical attention (Report these to your doctor or health care professional if they continue or are bothersome.): -changes in appetite -constipation or diarrhea -nausea -red, hot flush to face or skin -stomach upset or pain This list may not describe all possible side effects. Call your  doctor for medical advice about side effects. You may report side effects to FDA at 1-800-FDA-1088. Where should I keep my medicine? Keep out of the reach of children. Store at room temperature between 15 and 30 degrees C (59 and 86 degrees F).  Protect from light and moisture. Throw away any unused medicine after the expiration date. NOTE: This sheet is a summary. It may not cover all possible information. If you have questions about this medicine, talk to your doctor, pharmacist, or health care provider.    2016, Elsevier/Gold Standard. (2011-03-09 17:10:51)
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
## 2016-08-15 ENCOUNTER — Ambulatory Visit: Payer: Self-pay | Admitting: Family Medicine

## 2016-08-15 ENCOUNTER — Encounter: Payer: Self-pay | Admitting: Family Medicine

## 2016-08-16 ENCOUNTER — Telehealth: Payer: Self-pay | Admitting: *Deleted

## 2016-08-16 NOTE — Telephone Encounter (Signed)
PA submitted through covermymed. Key: [id number]

## 2016-08-17 NOTE — Telephone Encounter (Signed)
uloric approved from 08/16/2016-11/04/2038  patient and pharm notified

## 2016-08-21 ENCOUNTER — Encounter: Payer: Self-pay | Admitting: Family Medicine

## 2016-08-21 ENCOUNTER — Ambulatory Visit (INDEPENDENT_AMBULATORY_CARE_PROVIDER_SITE_OTHER): Payer: BLUE CROSS/BLUE SHIELD | Admitting: Family Medicine

## 2016-08-21 ENCOUNTER — Ambulatory Visit (INDEPENDENT_AMBULATORY_CARE_PROVIDER_SITE_OTHER): Payer: BLUE CROSS/BLUE SHIELD

## 2016-08-21 VITALS — BP 129/66 | HR 83 | Temp 98.5°F | Ht 67.0 in | Wt 175.0 lb

## 2016-08-21 DIAGNOSIS — G8929 Other chronic pain: Secondary | ICD-10-CM | POA: Diagnosis not present

## 2016-08-21 DIAGNOSIS — M545 Low back pain: Secondary | ICD-10-CM | POA: Diagnosis not present

## 2016-08-21 DIAGNOSIS — R1010 Upper abdominal pain, unspecified: Secondary | ICD-10-CM

## 2016-08-21 DIAGNOSIS — R319 Hematuria, unspecified: Secondary | ICD-10-CM

## 2016-08-21 DIAGNOSIS — R1013 Epigastric pain: Secondary | ICD-10-CM | POA: Diagnosis not present

## 2016-08-21 DIAGNOSIS — K760 Fatty (change of) liver, not elsewhere classified: Secondary | ICD-10-CM

## 2016-08-21 DIAGNOSIS — R3129 Other microscopic hematuria: Secondary | ICD-10-CM | POA: Diagnosis not present

## 2016-08-21 LAB — POCT URINALYSIS DIPSTICK
Bilirubin, UA: NEGATIVE
GLUCOSE UA: NEGATIVE
Ketones, UA: NEGATIVE
Leukocytes, UA: NEGATIVE
NITRITE UA: NEGATIVE
PH UA: 6.5
Protein, UA: NEGATIVE
UROBILINOGEN UA: 1
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
## 2016-08-22 ENCOUNTER — Telehealth: Payer: Self-pay | Admitting: *Deleted

## 2016-08-22 ENCOUNTER — Telehealth: Payer: Self-pay | Admitting: Family Medicine

## 2016-08-22 LAB — URINALYSIS, MICROSCOPIC ONLY
BACTERIA UA: NONE SEEN [HPF]
CRYSTALS: NONE SEEN [HPF]
Casts: NONE SEEN [LPF]
RBC / HPF: NONE SEEN RBC/HPF (ref <=2)
SQUAMOUS EPITHELIAL / LPF: NONE SEEN [HPF] (ref <=5)
WBC, UA: NONE SEEN WBC/HPF (ref <=5)
YEAST: NONE SEEN [HPF]

## 2016-08-22 NOTE — Telephone Encounter (Signed)
Received information from [facility] advising the urine culture order will be cancelled due to specimen being sent in wrong container. Will route to treating Physician to see if Pt should come in for recollection.

## 2016-08-22 NOTE — Telephone Encounter (Signed)
Pt called back and stated that he feels nauseated and constipated today. Asked if he was running a fever he stated that he feels a little hotter than normal. Asked if he has vomited he stated that he has not. Asked if he has been able to eat. He stated that he has tried to eat a little something but not much he has been able to stay hydrated. Pt advised that if his sxs become exacerbated he should go directly to UC or the ED to be seen. I did speak with pt's pcp about his condition and he agreed to plan. Pt advised and agreed pt also informed if he can call an schedule an appt for Friday. Pt voiced understanding and agreed.Audelia Hives [place]

## 2016-08-23 ENCOUNTER — Emergency Department (INDEPENDENT_AMBULATORY_CARE_PROVIDER_SITE_OTHER)
Admission: EM | Admit: 2016-08-23 | Discharge: 2016-08-23 | Disposition: A | Discharge status: Home / Self Care | Payer: BLUE CROSS/BLUE SHIELD | Source: Home / Self Care | Attending: Family Medicine | Admitting: Family Medicine

## 2016-08-23 ENCOUNTER — Encounter: Payer: Self-pay | Admitting: *Deleted

## 2016-08-23 ENCOUNTER — Emergency Department (INDEPENDENT_AMBULATORY_CARE_PROVIDER_SITE_OTHER): Payer: BLUE CROSS/BLUE SHIELD

## 2016-08-23 DIAGNOSIS — R1013 Epigastric pain: Secondary | ICD-10-CM | POA: Diagnosis not present

## 2016-08-23 DIAGNOSIS — R1030 Lower abdominal pain, unspecified: Secondary | ICD-10-CM | POA: Diagnosis not present

## 2016-08-23 LAB — POCT CBC W AUTO DIFF (K'VILLE URGENT CARE)

## 2016-08-23 LAB — TSH: TSH: 1.49 mIU/L (ref 0.40–4.50)

## 2016-08-23 NOTE — Discharge Instructions (Signed)
Begin clear liquids for about 24 hours, then may begin a BRAT diet (Bananas, Rice, Applesauce, Toast) when nausea and dizziness resolved.  Then gradually advance to a regular diet as tolerated.  Avoid milk products until well.   If symptoms become significantly worse during the night or over the weekend, proceed to the local emergency room.

## 2016-08-23 NOTE — ED Triage Notes (Signed)
Pt c/o abdominal pain and vomiting 3 nights ago. Was seen by PCP the next day, US WNL. Since, he has not vomited but feels nausea and abdominal pain after eating. He also reports feeling constipated.

## 2016-08-23 NOTE — ED Provider Notes (Signed)
[name]    CSN: [id number] Arrival date & time: 08/23/16  1058     History   Chief Complaint Chief Complaint  Patient presents with  . Abdominal Pain    HPI Roger Marquez is a 30 y.o. male.   Four days ago patient developed nausea and one episode of vomiting.  No diarrhea but he feels constipated (last normal bowel movement 4 days ago).  He has nausea after eating.  He complains of vague crampy abdominal pain and feels bloated.  He has had chills but no fever.  He has a history of GERD that has improved.  He saw his PCP initially who ordered an abdominal ultrasound that was negative.   The history is provided by the patient.  Abdominal Pain  Pain location:  Periumbilical Pain quality: bloating and fullness   Pain radiates to:  Does not radiate Pain severity:  Moderate Duration:  3 days Timing:  Intermittent Progression:  Unchanged Chronicity:  New Context: eating   Context: not alcohol use, not awakening from sleep, not diet changes, not laxative use, not previous surgeries, not recent illness, not recent travel, not sick contacts, not suspicious food intake and not trauma   Relieved by:  Nothing Worsened by:  Eating Ineffective treatments:  None tried Associated symptoms: anorexia, chills, constipation, fatigue, nausea and vomiting   Associated symptoms: no belching, no chest pain, no cough, no diarrhea, no dysuria, no fever, no flatus, no hematemesis, no hematochezia, no hematuria and no melena     Past Medical History:  Diagnosis Date  . Complication of anesthesia   . Hematuria, microscopic    occ ,followed by urologist  . Inguinal hernia    right  . PONV (postoperative nausea and vomiting)     Patient Active Problem List   Diagnosis Date Noted  . Hematuria, microscopic 08/21/2016  . Lumbago 08/13/2016  . Vitamin D deficiency 05/19/2016  . Elevated uric acid in blood 05/19/2016  . Benign mole 05/16/2016  . Arthralgia 05/15/2016  .  Inguinodynia 12/27/2011  . INGUINAL HERNIA, RIGHT 01/30/2011    Past Surgical History:  Procedure Laterality Date  . HERNIA REPAIR  03/27/11   right  . INGUINAL HERNIA REPAIR Bilateral 01/04/2015   Procedure: LAPAROSCOPIC BILATERAL INGUINAL HERNIA REPAIR WITH MESH;  Surgeon: Erroll Luna, MD;  Location: [facility];  Service: General;  Laterality: Bilateral;  . INSERTION OF MESH Bilateral 01/04/2015   Procedure: INSERTION OF MESH;  Surgeon: Erroll Luna, MD;  Location: [facility];  Service: General;  Laterality: Bilateral;  . LEG SURGERY     bilateral  . WISDOM TOOTH EXTRACTION         Home Medications    Prior to Admission medications   Medication Sig Start Date End Date Taking? Authorizing Provider  febuxostat (ULORIC) 40 MG tablet Take 1 tablet (40 mg total) by mouth daily. Pt could not tolerate allopurinol Patient not taking: Reported on 08/21/2016 08/13/16   Gregor Hams, MD  meloxicam (MOBIC) 15 MG tablet Take 1 tablet (15 mg total) by mouth daily as needed for pain. Patient not taking: Reported on 08/21/2016 05/15/16   Gregor Hams, MD  omeprazole (PRILOSEC) 40 MG capsule Take 1 capsule (40 mg total) by mouth daily. 08/13/16   Gregor Hams, MD    Family History Family History  Problem Relation Age of Onset  . Diabetes Father   . Hypertension Father     Social History Social History  Substance Use Topics  .  Smoking status: Never Smoker  . Smokeless tobacco: Never Used  . Alcohol use Yes     Comment: beer occ     Allergies   Review of patient's allergies indicates no known allergies.   Review of Systems Review of Systems  Constitutional: Positive for chills and fatigue. Negative for fever.  Respiratory: Negative for cough.   Cardiovascular: Negative for chest pain.  Gastrointestinal: Positive for abdominal pain, anorexia, constipation, nausea and vomiting. Negative for diarrhea, flatus, hematemesis, hematochezia and melena.  Genitourinary: Negative for dysuria and  hematuria.  All other systems reviewed and are negative.    Physical Exam Triage Vital Signs ED Triage Vitals  Enc Vitals Group     BP 08/23/16 1118 122/80     Pulse Rate 08/23/16 1118 71     Resp --      Temp 08/23/16 1118 98.6 F (37 C)     Temp Source 08/23/16 1118 Oral     SpO2 08/23/16 1118 98 %     Weight 08/23/16 1119 174 lb (78.9 kg)     Height --      Head Circumference --      Peak Flow --      Pain Score 08/23/16 1119 2     Pain Loc --      Pain Edu? --      Excl. in [place]? --    No data found.   Updated Vital Signs BP 122/80 (BP Location: Left Arm)   Pulse 71   Temp 98.6 F (37 C) (Oral)   Wt 174 lb (78.9 kg)   SpO2 98%   BMI 27.25 kg/m   Visual Acuity Right Eye Distance:   Left Eye Distance:   Bilateral Distance:    Right Eye Near:   Left Eye Near:    Bilateral Near:     Physical Exam  Constitutional: He appears well-developed and well-nourished. No distress.  HENT:  Head: Normocephalic.  Nose: Nose normal.  Mouth/Throat: Oropharynx is clear and moist.  Eyes: Conjunctivae are normal. Pupils are equal, round, and reactive to light.  Neck: Neck supple. No thyromegaly present.  Cardiovascular: Normal heart sounds.   Pulmonary/Chest: Breath sounds normal.  Abdominal: Soft. Bowel sounds are normal. He exhibits no distension and no mass. There is tenderness in the right lower quadrant and left lower quadrant. There is no rebound, no guarding, no tenderness at McBurney's point and negative Murphy's sign.    Vague peri-umbilical and hypogastric tenderness to palpation  as noted on diagram.   Musculoskeletal: He exhibits no edema.  Lymphadenopathy:    He has no cervical adenopathy.  Neurological: He is alert.  Skin: No rash noted.  Nursing note and vitals reviewed.    UC Treatments / Results  Labs (all labs ordered are listed, but only abnormal results are displayed) Labs Reviewed  TSH  POCT CBC W AUTO DIFF ([facility]):  WBC 4.8;  LY 40.3; MO 11.6; GR 48.1; Hgb 16.0; Platelets 244     EKG  EKG Interpretation None       Radiology Dg Abdomen 1 View  Result Date: 08/23/2016 CLINICAL DATA:  Epigastric abdominal pain and tenderness, vomiting, constipation EXAM: ABDOMEN - 1 VIEW COMPARISON:  None. FINDINGS: Normal bowel gas pattern without significant dilatation, obstruction or ileus. Lung bases are clear. No abnormal calcifications. No acute osseous finding. IMPRESSION: Negative exam. Electronically Signed   By: Jerilynn Mages.  Shick M.D.   On: 08/23/2016 12:18   US Abdomen Complete  Result  Date: 08/21/2016 CLINICAL DATA:  Epigastric and abdominal pain with vomiting. EXAM: ABDOMEN ULTRASOUND COMPLETE COMPARISON:  11/20/2013 CT abdomen and pelvis FINDINGS: Gallbladder: No gallstones or wall thickening visualized. No sonographic Murphy sign noted by sonographer. Common bile duct: Diameter: 2 mm Liver: No focal lesion identified. Echogenic liver parenchyma consistent fatty infiltration. IVC: No abnormality visualized. Pancreas: Unremarkable as visualized. Spleen: Size and appearance within normal limits. Sagittal span approximately 7.7 cm. Right Kidney: Length: 8.8 cm. Echogenicity within normal limits. No mass or hydronephrosis visualized. Left Kidney: Length: 9.1 cm. Echogenicity within normal limits. No mass or hydronephrosis visualized. Abdominal aorta: No aneurysm visualized. Maximum AP caliber 1.7 cm noted proximally. Other findings: None. IMPRESSION: Fatty liver. Electronically Signed   By: Ashley Royalty M.D.   On: 08/21/2016 18:10    Procedures Procedures (including critical care time)  Medications Ordered in UC Medications - No data to display   Initial Impression / Assessment and Plan / UC Course  I have reviewed the triage vital signs and the nursing notes.  Pertinent labs & imaging results that were available during my care of the patient were reviewed by me and considered in my medical decision making (see chart for  details).  Clinical Course  Unremarkable KUB, US abdomen, and normal WBC reassuring. Suspect viral syndrome. Will check TSH (note normal TSH and Hgb A1C on 05/15/16) Begin clear liquids for about 24 hours, then may begin a BRAT diet (Bananas, Rice, Applesauce, Toast) when nausea and dizziness resolved.  Then gradually advance to a regular diet as tolerated.  Avoid milk products until well.   If symptoms become significantly worse during the night or over the weekend, proceed to the local emergency room.  Followup with Family Doctor if not improved in about 5 days.     Final Clinical Impressions(s) / UC Diagnoses   Final diagnoses:  Lower abdominal pain    New Prescriptions New Prescriptions   No medications on file     Kandra Nicolas, MD 08/30/16 [phone]

## 2016-08-24 ENCOUNTER — Telehealth: Payer: Self-pay | Admitting: *Deleted

## 2016-08-24 NOTE — Telephone Encounter (Signed)
Spoke to pt given TSH results. He reports that he is feeling some better. He will f/u with dr Georgina Snell as needed.Charna Archer, LPN

## 2016-11-01 ENCOUNTER — Ambulatory Visit
Admission: EM | Admit: 2016-11-01 | Discharge: 2016-11-01 | Disposition: A | Discharge status: Home / Self Care | Payer: Worker's Compensation | Attending: Emergency Medicine | Admitting: Emergency Medicine

## 2016-11-01 DIAGNOSIS — S060X0A Concussion without loss of consciousness, initial encounter: Secondary | ICD-10-CM | POA: Diagnosis not present

## 2016-11-01 DIAGNOSIS — S0090XA Unspecified superficial injury of unspecified part of head, initial encounter: Secondary | ICD-10-CM | POA: Diagnosis not present

## 2016-11-01 DIAGNOSIS — S0990XA Unspecified injury of head, initial encounter: Secondary | ICD-10-CM

## 2016-11-01 MED ORDER — TRAMADOL HCL 50 MG PO TABS: ORAL_TABLET | ORAL | 0 refills | Status: DC

## 2016-11-01 NOTE — ED Notes (Signed)
Urine drug screen obtained in office.

## 2016-11-01 NOTE — Discharge Instructions (Signed)
No driving until you see [place], occupational health in 5 days. You may take 1 g of Tylenol up to 4 times a day as needed for pain. No ibuprofen or other NSAIDs because it can increase her risk of a bleed. Tramadol as needed for severe pain. Go to the ER for the signs and symptoms we discussed.
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
## 2016-11-27 ENCOUNTER — Encounter: Payer: Self-pay | Admitting: Family Medicine

## 2016-11-27 ENCOUNTER — Ambulatory Visit (INDEPENDENT_AMBULATORY_CARE_PROVIDER_SITE_OTHER): Payer: BLUE CROSS/BLUE SHIELD

## 2016-11-27 ENCOUNTER — Ambulatory Visit (INDEPENDENT_AMBULATORY_CARE_PROVIDER_SITE_OTHER): Payer: BLUE CROSS/BLUE SHIELD | Admitting: Family Medicine

## 2016-11-27 VITALS — BP 129/81 | HR 76 | Wt 176.0 lb

## 2016-11-27 DIAGNOSIS — M546 Pain in thoracic spine: Secondary | ICD-10-CM | POA: Diagnosis not present

## 2016-11-27 DIAGNOSIS — M549 Dorsalgia, unspecified: Secondary | ICD-10-CM | POA: Insufficient documentation

## 2016-11-27 DIAGNOSIS — E790 Hyperuricemia without signs of inflammatory arthritis and tophaceous disease: Secondary | ICD-10-CM

## 2016-11-27 DIAGNOSIS — M25532 Pain in left wrist: Secondary | ICD-10-CM

## 2016-11-27 DIAGNOSIS — M25531 Pain in right wrist: Secondary | ICD-10-CM | POA: Insufficient documentation

## 2016-11-27 DIAGNOSIS — M85441 Solitary bone cyst, right hand: Secondary | ICD-10-CM | POA: Diagnosis not present

## 2016-11-27 MED ORDER — FEBUXOSTAT 40 MG PO TABS: 40.0000 mg | ORAL_TABLET | Freq: Every day | ORAL | 1 refills | Status: DC

## 2016-11-27 NOTE — Patient Instructions (Addendum)
Thank you for coming in today.  Get xray today.  Attend PT for you upper back and wrists.   Recheck in 6 weeks if not better.   At that time we will likely do a fancy wrist MRI to evaluate the ligaments and tendons.   Extensor Carpi Ulnaris Instability Introduction Tendons attach muscles to bones. They also help with joint movements. When a tendon is injured or it moves out of its normal position, the joint movements can be affected. The extensor carpi ulnaris (ECU) tendon is located on the back of the wrist, on the side that is closest to the little finger. Itruns under a band of fibrous tissue (extensor retinaculum) to stabilize its position in the wrist. This tendonhelps with extending or bending back your wrist and angling your wrist toward your body. It also helps with gripping and pulling items close to your body. Injury to the ECU may result in ECU instability and hand and wrist weakness. Damage to the retinaculum causes the ECU tendon to slip in and out of the bony groove (subluxation) that it normally lies in or to come out of the groove completely (dislocation). What are the causes? This condition often happens with repeated activities that require forceful rotation or extension of the wrist. Other causes of ECU instability include:  Forced (traumatic) rotation of the wrist to a palm-up position.  A shallow or malformed groove in the bone where the ECU normally lies. What increases the risk? This condition is more likely to develop in:  People who play sports that include repeated, forceful turning up of the wrist into a palm-up position, such as tennis, golf, and weight lifting.  People who have had a previous wrist injury.  People who have poor wrist strength and flexibility.  People who do not warm up properly before activities. What are the signs or symptoms? Symptoms of this condition include:  Painful or painless "snapping" feeling over the back of the wrist-on the  little finger side-when you rotate your wrist into the palm-up position.  Swelling of your injured wrist.  Pain when the injured area is touched.  Weakness in your wrist when you turn it to the palm-up position. How is this diagnosed? This condition is diagnosed with a medical history and physical exam. You may also have imaging studies to confirm the diagnosis. These can include:  Ultrasound.  MRI. How is this treated? Treatment may include the use of icing and medicines to reduce pain and swelling.You may also be advised to wear a splint or brace to limit your wrist motion. In less severe cases, treatment may also include working with a physical therapist to strengthen your wrist and stabilize your ECU. In severe cases, surgery may be needed. Follow these instructions at home: If you have a splint or brace:  Wear it as told by your health care provider. Remove it only as told by your health care provider.  Loosen the splint or brace if your fingers become numb and tingle, or if they turn cold and blue.  Keep the splint or brace clean and dry. Managing pain, stiffness, and swelling  If directed, apply ice to the injured area.  Put ice in a plastic bag.  Place a towel between your skin and the bag.  Leave the ice on for 20 minutes, 2-3 times per day.  Move your fingers often to avoid stiffness and to lessen swelling.  Raise (elevate) the injured area above the level of your heart while you  are sitting or lying down. General instructions  Return to your normal activities as told by your health care provider. Ask your health care provider what activities are safe for you.  Take over-the-counter and prescription medicines only as told by your health care provider.  Keep all follow-up visits as told by your health care provider. This is important.  Do not drive or operate heavy machinery while taking prescription pain medicine. Contact a health care provider if:  Your  pain, tenderness, or swelling gets worse, even if you have had treatment.  You have numbness or tingling in your wrist, hand, or fingers on the injured side. This information is not intended to replace advice given to you by your health care provider. Make sure you discuss any questions you have with your health care provider. Document Released: 10/22/2005 Document Revised: 03/29/2016 Document Reviewed: 12/24/2014  2017 [employer]
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
## 2016-12-11 ENCOUNTER — Encounter: Payer: Self-pay | Admitting: Physical Therapy

## 2016-12-11 ENCOUNTER — Ambulatory Visit: Payer: BLUE CROSS/BLUE SHIELD | Attending: Family Medicine | Admitting: Physical Therapy

## 2016-12-11 DIAGNOSIS — R293 Abnormal posture: Secondary | ICD-10-CM

## 2016-12-11 DIAGNOSIS — M545 Low back pain, unspecified: Secondary | ICD-10-CM

## 2016-12-11 DIAGNOSIS — G8929 Other chronic pain: Secondary | ICD-10-CM | POA: Insufficient documentation

## 2016-12-11 DIAGNOSIS — M25531 Pain in right wrist: Secondary | ICD-10-CM | POA: Diagnosis present

## 2016-12-11 DIAGNOSIS — M6281 Muscle weakness (generalized): Secondary | ICD-10-CM

## 2016-12-11 DIAGNOSIS — M25532 Pain in left wrist: Secondary | ICD-10-CM
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
## 2016-12-14 ENCOUNTER — Ambulatory Visit: Payer: BLUE CROSS/BLUE SHIELD | Admitting: Physical Therapy

## 2016-12-14 ENCOUNTER — Encounter: Payer: Self-pay | Admitting: Physical Therapy

## 2016-12-14 DIAGNOSIS — M25531 Pain in right wrist: Secondary | ICD-10-CM

## 2016-12-14 DIAGNOSIS — M6281 Muscle weakness (generalized): Secondary | ICD-10-CM

## 2016-12-14 DIAGNOSIS — G8929 Other chronic pain: Secondary | ICD-10-CM

## 2016-12-14 DIAGNOSIS — M545 Low back pain, unspecified: Secondary | ICD-10-CM

## 2016-12-14 DIAGNOSIS — R293 Abnormal posture: Secondary | ICD-10-CM

## 2016-12-14 DIAGNOSIS — M25532 Pain in left wrist: Secondary | ICD-10-CM

## 2016-12-14 NOTE — Therapy (Signed)
[facility] [address] [place], [place], [address] Phone: [phone]   Fax:  [phone]  Physical Therapy Treatment  Patient Details  Name: Roger Marquez MRN: [id number] Date of Birth: 31-Dec-1985 Referring Provider: Lynne Leader, MD  Encounter Date: 12/14/2016      PT End of Session - 12/14/16 0733    Visit Number 2   Number of Visits 4   Date for PT Re-Evaluation 01/08/17   PT Start Time 0727   PT Stop Time 0812   PT Time Calculation (min) 45 min   Activity Tolerance Patient tolerated treatment well;Patient limited by pain   Behavior During Therapy [facility] for tasks assessed/performed      Past Medical History:  Diagnosis Date  . Complication of anesthesia   . Hematuria, microscopic    occ ,followed by urologist  . Inguinal hernia    right  . PONV (postoperative nausea and vomiting)     Past Surgical History:  Procedure Laterality Date  . HERNIA REPAIR  03/27/11   right  . INGUINAL HERNIA REPAIR Bilateral 01/04/2015   Procedure: LAPAROSCOPIC BILATERAL INGUINAL HERNIA REPAIR WITH MESH;  Surgeon: Erroll Luna, MD;  Location: [facility];  Service: General;  Laterality: Bilateral;  . INSERTION OF MESH Bilateral 01/04/2015   Procedure: INSERTION OF MESH;  Surgeon: Erroll Luna, MD;  Location: [facility];  Service: General;  Laterality: Bilateral;  . LEG SURGERY     bilateral  . WISDOM TOOTH EXTRACTION      There were no vitals filed for this visit.      Subjective Assessment - 12/14/16 0732    Subjective Pt. presented to PT with 2/10 reported mid-back pain today. Pt. reports no wrist pain or new complaints at this time.      Pertinent History hx of back pain   Limitations Sitting;Lifting;Standing;Walking;House hold activities;Other (comment)   How long can you sit comfortably? frequently requires position change   How long can you stand comfortably? frequently requires position change   Patient Stated Goals decreased pain   Currently in Pain? Yes   Pain Score 2    Pain Location Back   Pain Orientation Right;Left;Mid;Upper   Pain Descriptors / Indicators Aching;Constant   Pain Type Chronic pain   Pain Onset More than a month ago   Pain Frequency Constant       OBJECTIVE: There.ex.: Reviewed HEP.  Issued Page #1-2 of TrA ex. Program (instructed to avoid any pain provoking movements).  Manual tx.:  Supine LE/lumbar stretches 5x each (static holds in pain tolerable range).  Prone grade I-II PA mobs. C7-L4 (1x20 sec. Each)- less tender but more hypomobile in lumbar spine.  MH to back in prone prior to/during mobs.      Pt response for medical necessity:  Pt. Benefits from progressive stretching/ core strengthening ex. Program to improve pain-free mobility.  Decrease pain with use of MH to back prior to manual tx.        PT Education - 12/14/16 0751    Education provided Yes   Education Details Page #1-2 of TrA ex. program.     Person(s) Educated Patient   Methods Explanation;Demonstration;Handout   Comprehension Verbalized understanding;Returned demonstration             PT Long Term Goals - 12/11/16 0920      PT LONG TERM GOAL #1   Title Patient will score <15% on the QuickDash to increase patient's ability to perform activities of daily  living.    Baseline Patient scored a 27% on 2/6.    Time 4   Period Weeks   Status New     PT LONG TERM GOAL #2   Title Patient will score <30% (self-perceived moderate disability) on the Modified Oswestry for more pain control during work.    Baseline Patient scored a 44% (severe percieved disability) on 2/6.    Time 4   Period Weeks   Status New     PT LONG TERM GOAL #3   Title Patient will be I in HEP to increase bilateral wrist extension MMT to 4+/5 to allow more functional strength for daily living activities and work.    Baseline Patient has 4-/5 wrist exension MMT bilaterally with pain   Time 4   Period Weeks   Status New     PT LONG TERM GOAL  #4   Title Patient will maintain static position/ posture in sitting for 10 minutes without need to reposition for pain alleviation to improve work tasks.    Baseline Patient requires frequent postional changes and is unable to maintain static posture without pain.    Time 4   Period Weeks   Status New            Plan - 12/14/16 0745    Clinical Impression Statement Pt. continues to remain limited by generalized thoracic spine/paraspinal tenderness with Grade I-II PA mobs.  Good B LE muscle flexibility with minimal discomfort at end-range piriformis stretching.  No treatment to B wrists today secondary to pain-free/ no new complaints.  PT recommmend pt. remain compliant with stretching/ icing of B wrists to manage symptoms.     Rehab Potential Fair   Clinical Impairments Affecting Rehab Potential hx of back pain   PT Frequency 1x / week   PT Duration 4 weeks   PT Treatment/Interventions ADLs/Self Care Home Management;Electrical Stimulation;Iontophoresis 4mg /ml Dexamethasone;Moist Heat;Ultrasound;Gait training;Functional mobility training;Therapeutic activities;Therapeutic exercise;Balance training;Neuromuscular re-education;Patient/family education;Manual techniques;Passive range of motion;Dry needling;Energy conservation;Splinting;Taping   PT Next Visit Plan Progress core stability/ Reassess thoracic spine tenderness.  Check wrist pain/tenderness.     PT Home Exercise Plan see handout   Consulted and Agree with Plan of Care Patient      Patient will benefit from skilled therapeutic intervention in order to improve the following deficits and impairments:  Decreased activity tolerance, Decreased endurance, Decreased mobility, Decreased strength, Impaired flexibility, Increased muscle spasms, Impaired perceived functional ability, Postural dysfunction, Improper body mechanics, Pain  Visit Diagnosis: Chronic bilateral low back pain without sciatica  Pain in left wrist  Pain in right  wrist  Abnormal posture  Muscle weakness (generalized)     Problem List Patient Active Problem List   Diagnosis Date Noted  . Bilateral wrist pain 11/27/2016  . Back pain 11/27/2016  . Hematuria, microscopic 08/21/2016  . Lumbago 08/13/2016  . Vitamin D deficiency 05/19/2016  . Elevated uric acid in blood 05/19/2016  . Benign mole 05/16/2016  . Arthralgia 05/15/2016  . Inguinodynia 12/27/2011  . INGUINAL HERNIA, RIGHT 01/30/2011   Pura Spice, PT, DPT # [id number] SPT 12/14/2016, 8:12 AM  [facility] [address] [place], [place], [address] Phone: [phone]   Fax:  [phone]  Name: Roger Marquez MRN: [id number] Date of Birth: [birth year]

## 2016-12-19 ENCOUNTER — Encounter: Payer: Self-pay | Admitting: Physical Therapy

## 2016-12-21 ENCOUNTER — Encounter: Payer: Self-pay | Admitting: Physical Therapy

## 2016-12-21 ENCOUNTER — Ambulatory Visit: Payer: BLUE CROSS/BLUE SHIELD | Admitting: Physical Therapy

## 2016-12-21 DIAGNOSIS — M545 Low back pain, unspecified: Secondary | ICD-10-CM

## 2016-12-21 DIAGNOSIS — R293 Abnormal posture: Secondary | ICD-10-CM

## 2016-12-21 DIAGNOSIS — M25531 Pain in right wrist: Secondary | ICD-10-CM

## 2016-12-21 DIAGNOSIS — M6281 Muscle weakness (generalized): Secondary | ICD-10-CM

## 2016-12-21 DIAGNOSIS — G8929 Other chronic pain: Secondary | ICD-10-CM

## 2016-12-21 DIAGNOSIS — M25532 Pain in left wrist: Secondary | ICD-10-CM

## 2016-12-21 NOTE — Therapy (Signed)
[facility] [address] [place], [place], [address] Phone: [phone]   Fax:  [phone]  Physical Therapy Treatment  Patient Details  Name: Roger Marquez MRN: [id number] Date of Birth: [birth year] Referring Provider: Lynne Leader, MD  Encounter Date: 12/21/2016      PT End of Session - 12/21/16 0747    Visit Number 3   Number of Visits 4   Date for PT Re-Evaluation 01/08/17   PT Start Time 0726   PT Stop Time 0822   PT Time Calculation (min) 56 min   Activity Tolerance Patient tolerated treatment well;Patient limited by pain   Behavior During Therapy [facility] for tasks assessed/performed      Past Medical History:  Diagnosis Date  . Complication of anesthesia   . Hematuria, microscopic    occ ,followed by urologist  . Inguinal hernia    right  . PONV (postoperative nausea and vomiting)     Past Surgical History:  Procedure Laterality Date  . HERNIA REPAIR  03/27/11   right  . INGUINAL HERNIA REPAIR Bilateral 01/04/2015   Procedure: LAPAROSCOPIC BILATERAL INGUINAL HERNIA REPAIR WITH MESH;  Surgeon: Erroll Luna, MD;  Location: [place];  Service: General;  Laterality: Bilateral;  . INSERTION OF MESH Bilateral 01/04/2015   Procedure: INSERTION OF MESH;  Surgeon: Erroll Luna, MD;  Location: [place];  Service: General;  Laterality: Bilateral;  . LEG SURGERY     bilateral  . WISDOM TOOTH EXTRACTION      There were no vitals filed for this visit.      Subjective Assessment - 12/21/16 0745    Subjective Patient presents to physical therapy session with decreased pain today and no noticeable wrist pain. Patient states the crease of his legs hurts a little when doing his HEP.    Pertinent History hx of back pain   Limitations Sitting;Lifting;Standing;Walking;House hold activities;Other (comment)   How long can you sit comfortably? frequently requires position change   How long can you stand comfortably? frequently requires  position change   Patient Stated Goals decreased pain   Currently in Pain? Yes   Pain Score 2    Pain Location Back   Pain Orientation Right;Left;Mid;Upper   Pain Descriptors / Indicators Aching;Constant   Pain Type Chronic pain   Pain Onset More than a month ago      OBJECTIVE: Manual tx: Prone Grade I and II PA mobilizations to lumbar and thoracic spine. C7-L4 (~20 seconds each level) 15 minutes. More hypomobile in lumbar spine Soft tissue manipulation of paraspinals with tenderness in T3-T8 region. LE PROM in prone: hip extension, knee flexion, hip int./ext. Rotn. MH to lumbar and thoracic regions in prone.   There.Ex.: Seated thoracic extension on theraball. Cues for relaxing, body mechanics, sequencing. Prayer stretch 30 sec. Static holds 3x followed by prone press-up to elbows. Pt. Instructed not to extend into pain. Cat/camel stretch in quadruped 5x 30 sec. Holds.   Pt. Given TENS unit for home use over the next week with instruction and returned understanding with demonstration of use.  IFC set-up on modulated II with 40% scan (10 min.)- pt. Instructed to use when pain worsens at home.  Handouts provided.    Pt response for medical necessity:  Pt. Benefits from progressive stretching/ core strengthening ex. Program to improve pain-free mobility.  Decrease pain with use of MH to back prior to manual tx.          PT Education -  12/21/16 0747    Education provided Yes   Education Details TENS   Person(s) Educated Patient   Methods Explanation;Demonstration   Comprehension Verbalized understanding             PT Long Term Goals - 12/11/16 0920      PT LONG TERM GOAL #1   Title Patient will score <15% on the QuickDash to increase patient's ability to perform activities of daily living.    Baseline Patient scored a 27% on 2/6.    Time 4   Period Weeks   Status New     PT LONG TERM GOAL #2   Title Patient will score <30% (self-perceived moderate disability) on  the Modified Oswestry for more pain control during work.    Baseline Patient scored a 44% (severe percieved disability) on 2/6.    Time 4   Period Weeks   Status New     PT LONG TERM GOAL #3   Title Patient will be I in HEP to increase bilateral wrist extension MMT to 4+/5 to allow more functional strength for daily living activities and work.    Baseline Patient has 4-/5 wrist exension MMT bilaterally with pain   Time 4   Period Weeks   Status New     PT LONG TERM GOAL #4   Title Patient will maintain static position/ posture in sitting for 10 minutes without need to reposition for pain alleviation to improve work tasks.    Baseline Patient requires frequent postional changes and is unable to maintain static posture without pain.    Time 4   Period Weeks   Status New            Plan - 12/21/16 0748    Clinical Impression Statement Patient presents to physical therapy session with continued pain (2/10 in low back). Patient has tenderness throughout spinal region and tolerated Grade I and II mobilizations well. Paraspinal musculature was tight upon palpation and responded well to gentle soft tissue mobilizations. Heat provided relief for muscular tightness and gentle PROM to legs was applied at the same time for optimal tissue lengthening. Pt. instructed in thoracic extension exercise using gym ball with cuing to activate core to help maintain balance. Pt. instructed through prayer stretch and quadruped cat/camel stretch to provide pain-free stretch to both lumbar and thoracic spine. Pt. given home TENS unit for pain relief when necessary and educated for proper use and returned understanding with demonstration.    Rehab Potential Fair   Clinical Impairments Affecting Rehab Potential hx of back pain   PT Frequency 1x / week   PT Duration 4 weeks   PT Treatment/Interventions ADLs/Self Care Home Management;Electrical Stimulation;Iontophoresis 4mg /ml Dexamethasone;Moist  Heat;Ultrasound;Gait training;Functional mobility training;Therapeutic activities;Therapeutic exercise;Balance training;Neuromuscular re-education;Patient/family education;Manual techniques;Passive range of motion;Dry needling;Energy conservation;Splinting;Taping   PT Next Visit Plan Progress core stability/ Reassess thoracic spine tenderness.  Check wrist pain/tenderness.     PT Home Exercise Plan see handout   Consulted and Agree with Plan of Care Patient      Patient will benefit from skilled therapeutic intervention in order to improve the following deficits and impairments:  Decreased activity tolerance, Decreased endurance, Decreased mobility, Decreased strength, Impaired flexibility, Increased muscle spasms, Impaired perceived functional ability, Postural dysfunction, Improper body mechanics, Pain  Visit Diagnosis: Chronic bilateral low back pain without sciatica  Pain in left wrist  Pain in right wrist  Abnormal posture  Muscle weakness (generalized)     Problem List Patient Active Problem List  Diagnosis Date Noted  . Bilateral wrist pain 11/27/2016  . Back pain 11/27/2016  . Hematuria, microscopic 08/21/2016  . Lumbago 08/13/2016  . Vitamin D deficiency 05/19/2016  . Elevated uric acid in blood 05/19/2016  . Benign mole 05/16/2016  . Arthralgia 05/15/2016  . Inguinodynia 12/27/2011  . INGUINAL HERNIA, RIGHT 01/30/2011   Pura Spice, PT, DPT # [id number] SPT 12/21/2016, 6:59 PM  [facility] [address] [place], [place], [address] Phone: [phone]   Fax:  [phone]  Name: Roger Marquez MRN: [id number] Date of Birth: Apr 05, 1986

## 2016-12-26 ENCOUNTER — Ambulatory Visit: Payer: BLUE CROSS/BLUE SHIELD | Admitting: Physical Therapy

## 2016-12-27 ENCOUNTER — Ambulatory Visit: Payer: BLUE CROSS/BLUE SHIELD | Admitting: Physical Therapy

## 2016-12-31 ENCOUNTER — Ambulatory Visit (INDEPENDENT_AMBULATORY_CARE_PROVIDER_SITE_OTHER): Payer: BLUE CROSS/BLUE SHIELD | Admitting: Family Medicine

## 2016-12-31 ENCOUNTER — Encounter: Payer: Self-pay | Admitting: Family Medicine

## 2016-12-31 VITALS — BP 143/80 | HR 73 | Wt 173.0 lb

## 2016-12-31 DIAGNOSIS — J039 Acute tonsillitis, unspecified: Secondary | ICD-10-CM

## 2016-12-31 MED ORDER — BENZONATATE 200 MG PO CAPS: 200.0000 mg | ORAL_CAPSULE | Freq: Three times a day (TID) | ORAL | 0 refills | Status: DC | PRN

## 2016-12-31 MED ORDER — CEFDINIR 300 MG PO CAPS: 300.0000 mg | ORAL_CAPSULE | Freq: Two times a day (BID) | ORAL | 0 refills | Status: DC

## 2016-12-31 NOTE — Patient Instructions (Signed)
Thank you for coming in today. Call or go to the emergency room if you get worse, have trouble breathing, have chest pains, or palpitations.  Use omnicef for sore throat.  Use tessalon for cough as needed.  Call or go to the emergency room if you get worse, have trouble breathing, have chest pains, or palpitations.    Acute Bronchitis, Adult Acute bronchitis is when air tubes (bronchi) in the lungs suddenly get swollen. The condition can make it hard to breathe. It can also cause these symptoms:  A cough.  Coughing up clear, yellow, or green mucus.  Wheezing.  Chest congestion.  Shortness of breath.  A fever.  Body aches.  Chills.  A sore throat. Follow these instructions at home: Medicines  Take over-the-counter and prescription medicines only as told by your doctor.  If you were prescribed an antibiotic medicine, take it as told by your doctor. Do not stop taking the antibiotic even if you start to feel better. General instructions  Rest.  Drink enough fluids to keep your pee (urine) clear or pale yellow.  Avoid smoking and secondhand smoke. If you smoke and you need help quitting, ask your doctor. Quitting will help your lungs heal faster.  Use an inhaler, cool mist vaporizer, or humidifier as told by your doctor.  Keep all follow-up visits as told by your doctor. This is important. How is this prevented? To lower your risk of getting this condition again:  Wash your hands often with soap and water. If you cannot use soap and water, use hand sanitizer.  Avoid contact with people who have cold symptoms.  Try not to touch your hands to your mouth, nose, or eyes.  Make sure to get the flu shot every year. Contact a doctor if:  Your symptoms do not get better in 2 weeks. Get help right away if:  You cough up blood.  You have chest pain.  You have very bad shortness of breath.  You become dehydrated.  You faint (pass out) or keep feeling like you are  going to pass out.  You keep throwing up (vomiting).  You have a very bad headache.  Your fever or chills gets worse. This information is not intended to replace advice given to you by your health care provider. Make sure you discuss any questions you have with your health care provider. Document Released: 04/09/2008 Document Revised: 05/30/2016 Document Reviewed: 04/11/2016 Elsevier Interactive Patient Education  2017 [employer]   Tonsillitis Tonsillitis is an infection of the throat. This infection causes the tonsils to become red, tender, and puffy (swollen). Tonsils are groups of tissue at the back of your throat. If bacteria caused your infection, antibiotic medicine will be given to you. Sometimes symptoms of tonsillitis can be relieved with the use of steroid medicine. If your tonsillitis is severe and happens often, you may need to get your tonsils removed (tonsillectomy). Follow these instructions at home:  Rest and sleep often.  Drink enough fluids to keep your pee (urine) clear or pale yellow.  While your throat is sore, eat soft or liquid foods like:  Soup.  Ice cream.  Instant breakfast drinks.  Eat frozen ice pops.  Gargle with a warm or cold liquid to help soothe the throat. Gargle with a water and salt mix. Mix 1/4 teaspoon of salt and 1/4 teaspoon of baking soda in 1 cup of water.  Only take medicines as told by your doctor.  If you are given medicines (antibiotics),  take them as told. Finish them even if you start to feel better. Contact a doctor if:  You have large, tender lumps in your neck.  You have a rash.  You cough up green, yellow-brown, or bloody fluid.  You cannot swallow liquids or food for 24 hours.  You notice that only one of your tonsils is swollen. Get help right away if:  You throw up (vomit).  You have a very bad headache.  You have a stiff neck.  You have chest pain.  You have trouble breathing or swallowing.  You have  bad throat pain, drooling, or your voice changes.  You have bad pain not helped by medicine.  You cannot fully open your mouth.  You have redness, puffiness, or bad pain in the neck.  You have a fever. This information is not intended to replace advice given to you by your health care provider. Make sure you discuss any questions you have with your health care provider. Document Released: 04/09/2008 Document Revised: 03/29/2016 Document Reviewed: 04/10/2013 Elsevier Interactive Patient Education  2017 [employer].

## 2016-12-31 NOTE — Progress Notes (Signed)
Roger Marquez is a 31 y.o. male who presents to [facility] today for cough congestion sore throat. Symptoms present for 2 weeks. Cough is nonproductive. Patient has had nausea but no vomiting. He's had a low measured temperature at home and 96.8. No fevers or chills. Patient has tried some over-the-counter medications which have helped. He said sick contacts with this child who is also been ill recently with a similar symptom.   Past Medical History:  Diagnosis Date  . Complication of anesthesia   . Hematuria, microscopic    occ ,followed by urologist  . Inguinal hernia    right  . PONV (postoperative nausea and vomiting)    Past Surgical History:  Procedure Laterality Date  . HERNIA REPAIR  03/27/11   right  . INGUINAL HERNIA REPAIR Bilateral 01/04/2015   Procedure: LAPAROSCOPIC BILATERAL INGUINAL HERNIA REPAIR WITH MESH;  Surgeon: Erroll Luna, MD;  Location: [place];  Service: General;  Laterality: Bilateral;  . INSERTION OF MESH Bilateral 01/04/2015   Procedure: INSERTION OF MESH;  Surgeon: Erroll Luna, MD;  Location: [place];  Service: General;  Laterality: Bilateral;  . LEG SURGERY     bilateral  . WISDOM TOOTH EXTRACTION     Social History  Substance Use Topics  . Smoking status: Never Smoker  . Smokeless tobacco: Never Used  . Alcohol use Yes     Comment: beer occ   family history includes Diabetes in his father; Hypertension in his father.  ROS as above:  Medications: Current Outpatient Prescriptions  Medication Sig Dispense Refill  . benzonatate (TESSALON) 200 MG capsule Take 1 capsule (200 mg total) by mouth 3 (three) times daily as needed for cough. 45 capsule 0  . cefdinir (OMNICEF) 300 MG capsule Take 1 capsule (300 mg total) by mouth 2 (two) times daily. 14 capsule 0  . febuxostat (ULORIC) 40 MG tablet Take 1 tablet (40 mg total) by mouth  daily. Pt could not tolerate allopurinol 90 tablet 1  . omeprazole (PRILOSEC) 40 MG capsule Take 1 capsule (40 mg total) by mouth daily. 30 capsule 3   No current facility-administered medications for this visit.    No Known Allergies  Health Maintenance Health Maintenance  Topic Date Due  . HIV Screening  12/25/2000  . INFLUENZA VACCINE  06/05/2016  . TETANUS/TDAP  05/15/2017 (Originally 12/25/2004)     Exam:  BP (!) 143/80   Pulse 73   Wt 173 lb (78.5 kg)   SpO2 99%   BMI 27.10 kg/m  Gen: Well NAD nontoxic appearing HEENT: EOMI,  MMM right tonsil is erythematous with whitish material present. Left tonsil is normal. No uvula deviation. No trismus. No cervical lymphadenopathy. Lungs: Normal work of breathing. CTABL occasional nonproductive cough Heart: RRR no MRG Abd: NABS, Soft. Nondistended, Nontender Exts: Brisk capillary refill, warm and well perfused.    No results found for this or any previous visit (from the past 72 hour(s)). No results found.    Assessment and Plan: 31 y.o. male with tonsillitis and viral URI versus postviral cough. Empiric treatment with Omnicef and Tessalon [place]. Additionally use over-the-counter medications. Return as needed. Work note provided.   No orders of the defined types were placed in this encounter.  Meds ordered this encounter  Medications  . cefdinir (OMNICEF) 300 MG capsule    Sig: Take 1 capsule (300 mg total) by mouth 2 (two) times daily.    Dispense:  14 capsule    Refill:  0  . benzonatate (TESSALON) 200 MG capsule    Sig: Take 1 capsule (200 mg total) by mouth 3 (three) times daily as needed for cough.    Dispense:  45 capsule    Refill:  0     Discussed warning signs or symptoms. Please see discharge instructions. Patient expresses understanding.

## 2017-01-01 ENCOUNTER — Encounter: Payer: BLUE CROSS/BLUE SHIELD | Admitting: Physical Therapy

## 2017-01-16 IMAGING — US US ABDOMEN COMPLETE
1 series · 14 of 25 positions shown · non-contrast
Comparison: 11/20/2013 CT abdomen and pelvis

CLINICAL DATA: Epigastric and abdominal pain with vomiting.

EXAM:
ABDOMEN ULTRASOUND COMPLETE

[Series 1: us abdomen complete · 0.18mm/px · 14 of 77 slices shown]
[im 1/77]
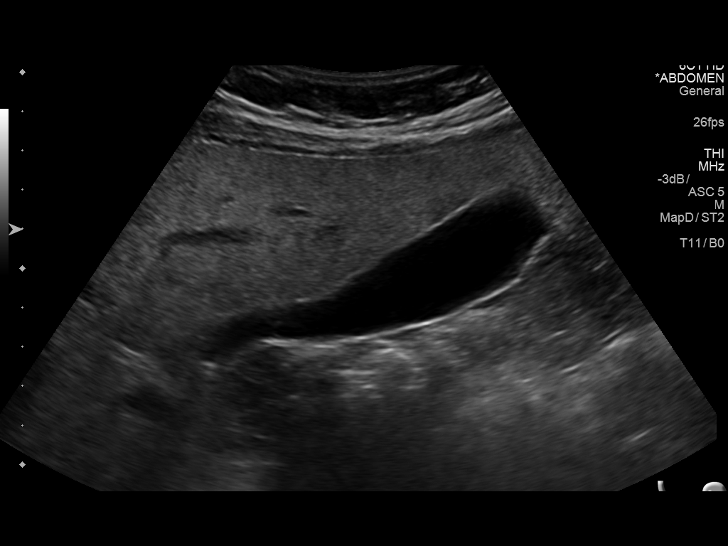
[im 7/77]
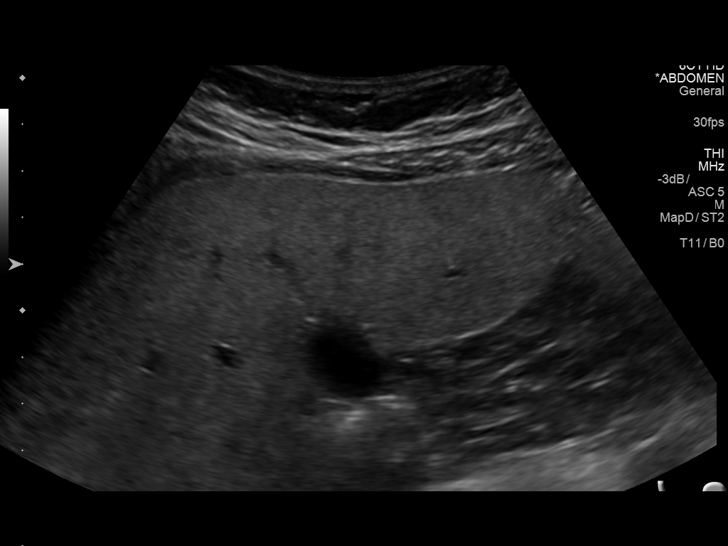
[im 13/77]
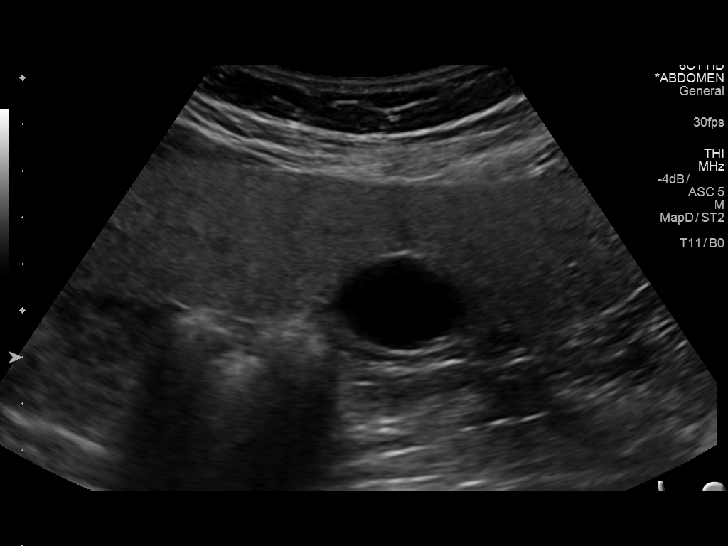
[im 20/77]
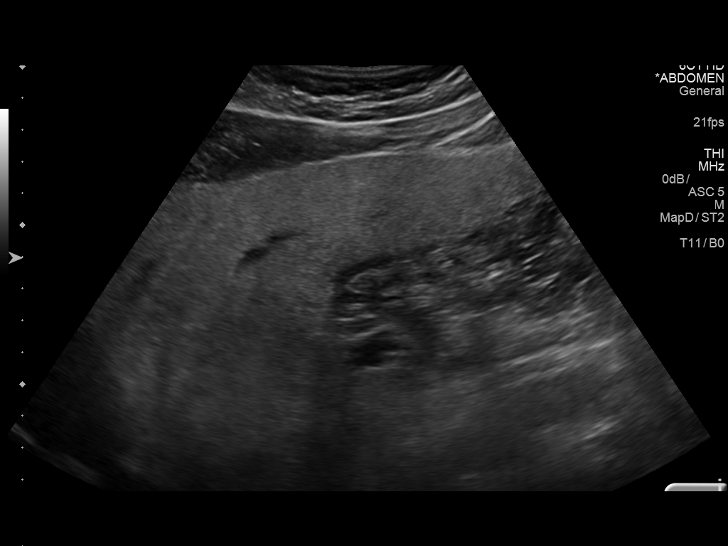
[im 26/77]
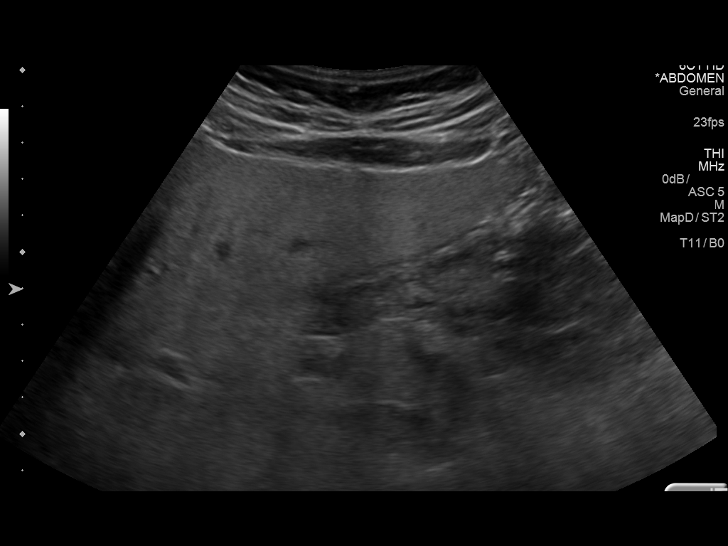
[im 29/77]
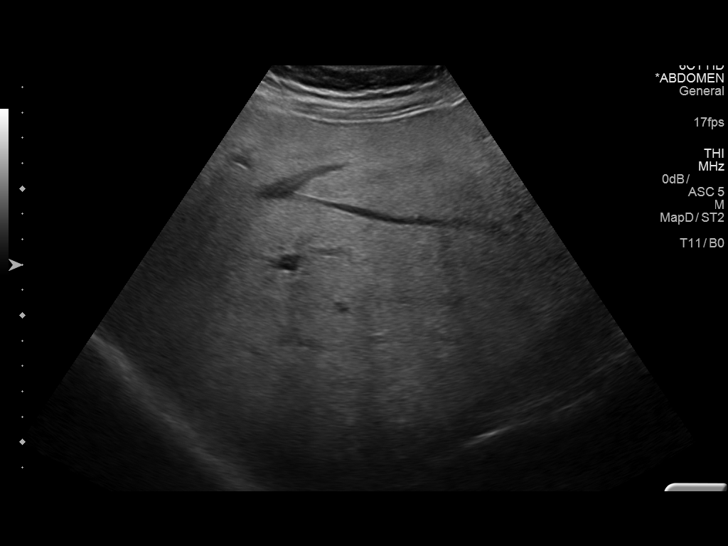
[im 35/77]
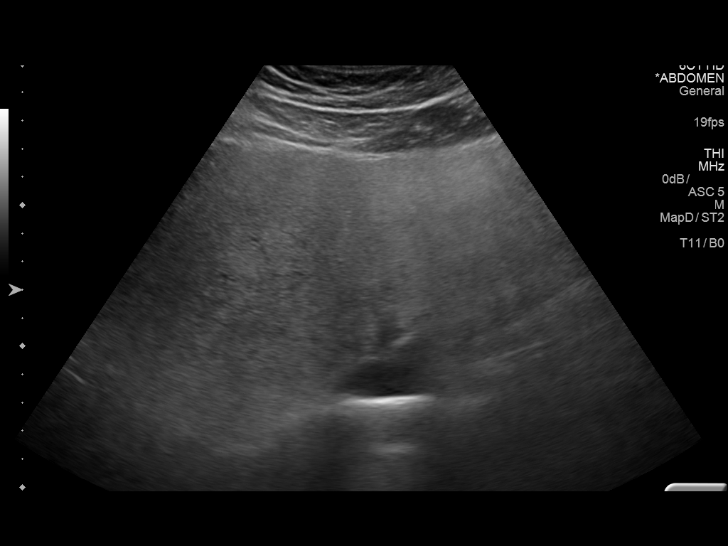
[im 42/77]
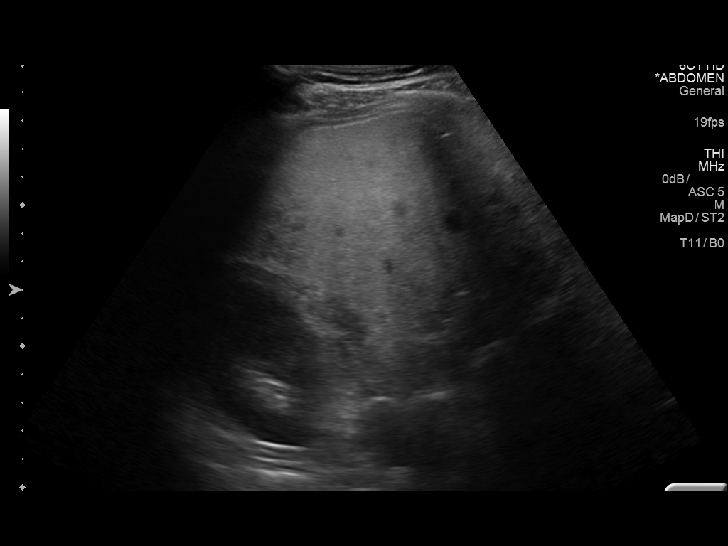
[im 48/77]
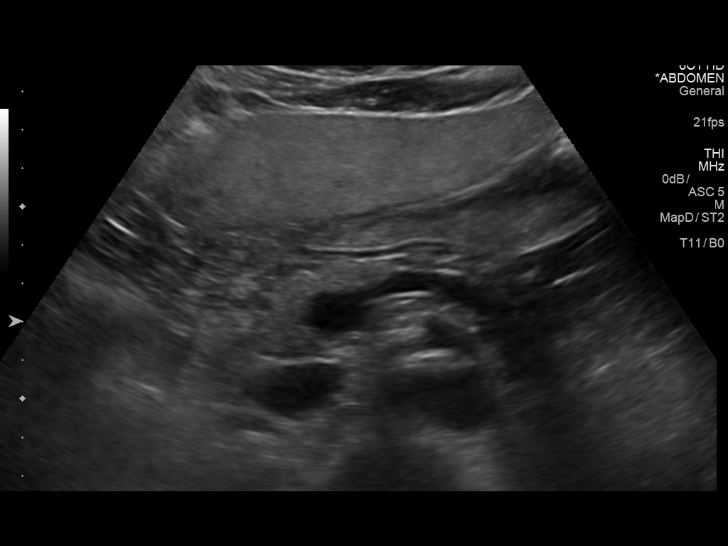
[im 51/77]
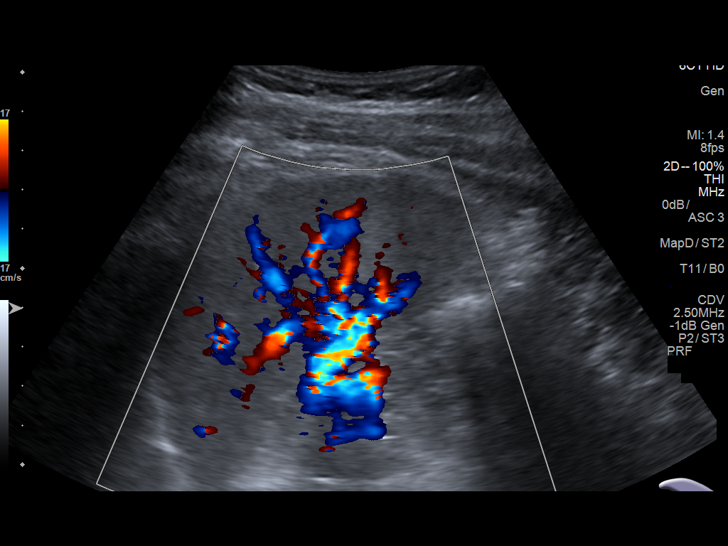
[im 58/77]
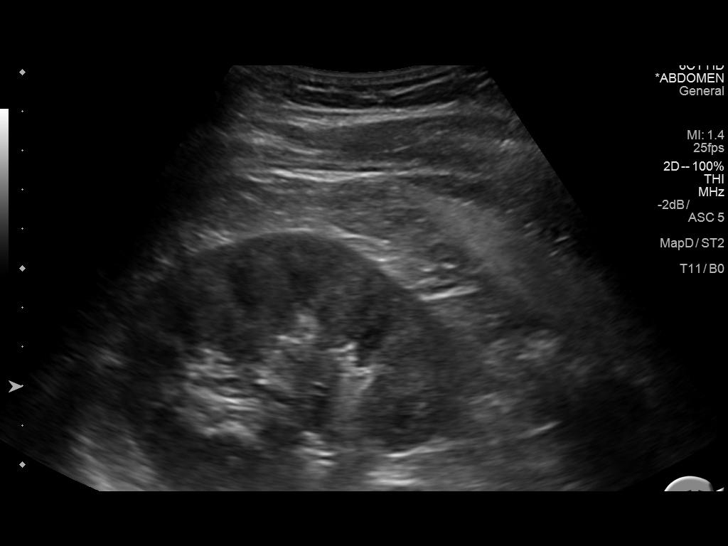
[im 64/77]
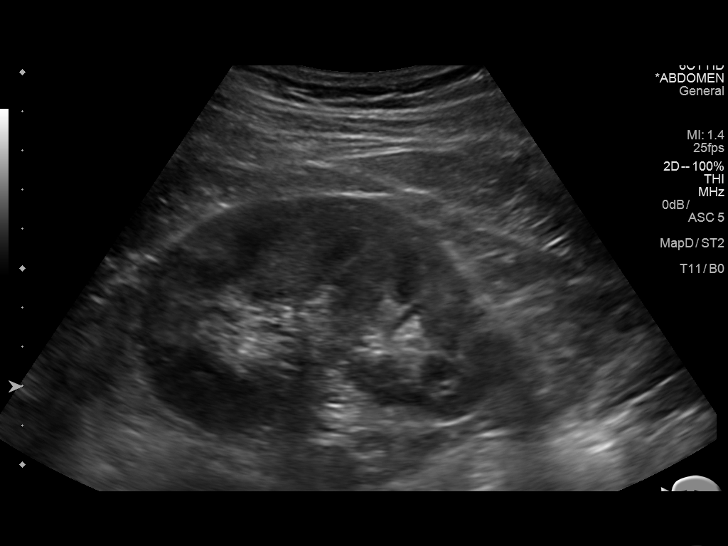
[im 70/77]
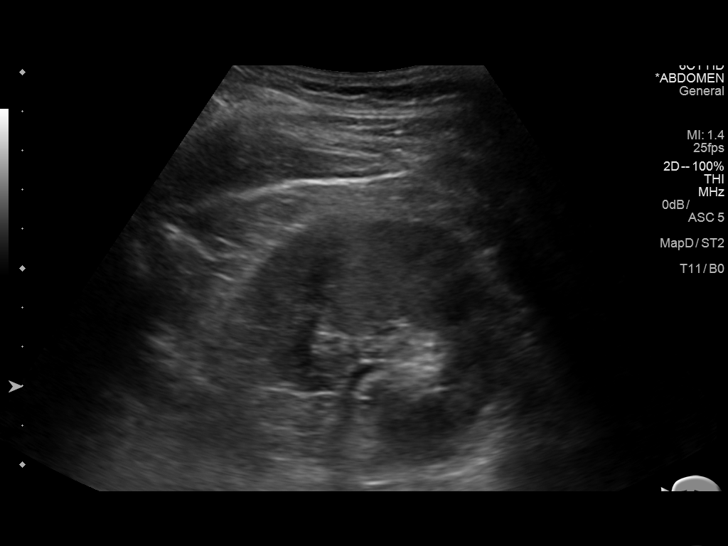
[im 77/77]
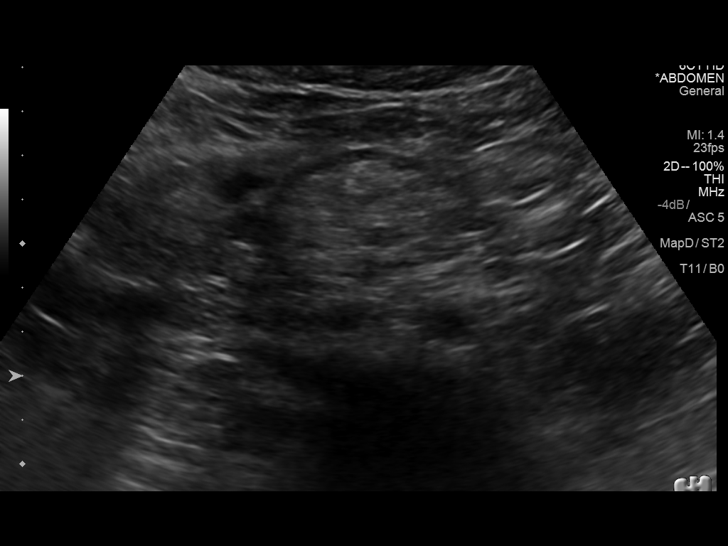

[14 of 25 positions shown; findings below may reference images not displayed]

FINDINGS: Gallbladder: No gallstones or wall thickening visualized. No
sonographic Murphy sign noted by sonographer.

Common bile duct: Diameter: 2 mm

Liver: No focal lesion identified. Echogenic liver parenchyma
consistent fatty infiltration.

IVC: No abnormality visualized.

Pancreas: Unremarkable as visualized.

Spleen: Size and appearance within normal limits. Sagittal span
approximately 7.7 cm.

Right Kidney: Length: 8.8 cm. Echogenicity within normal limits. No
mass or hydronephrosis visualized.

Left Kidney: Length: 9.1 cm. Echogenicity within normal limits. No
mass or hydronephrosis visualized.

Abdominal aorta: No aneurysm visualized. Maximum AP caliber 1.7 cm
noted proximally.

Other findings: None.
IMPRESSION: Fatty liver.

## 2017-01-22 ENCOUNTER — Ambulatory Visit: Payer: BLUE CROSS/BLUE SHIELD | Attending: Family Medicine | Admitting: Physical Therapy

## 2017-01-22 DIAGNOSIS — G8929 Other chronic pain: Secondary | ICD-10-CM | POA: Insufficient documentation

## 2017-01-22 DIAGNOSIS — M6281 Muscle weakness (generalized): Secondary | ICD-10-CM | POA: Diagnosis present

## 2017-01-22 DIAGNOSIS — M25531 Pain in right wrist: Secondary | ICD-10-CM | POA: Diagnosis present

## 2017-01-22 DIAGNOSIS — R293 Abnormal posture: Secondary | ICD-10-CM

## 2017-01-22 DIAGNOSIS — M545 Low back pain, unspecified: Secondary | ICD-10-CM

## 2017-01-22 DIAGNOSIS — M25532 Pain in left wrist: Secondary | ICD-10-CM | POA: Diagnosis present

## 2017-01-22 NOTE — Therapy (Signed)
[place] [facility] [address] [place], [place], [address] Phone: [phone]   Fax:  [phone]  Physical Therapy Treatment  Patient Details  Name: Roger Marquez MRN: [id number] Date of Birth: 1986-09-09 Referring Provider: Lynne Leader, MD  Encounter Date: 01/22/2017      PT End of Session - 01/22/17 1221    Visit Number 4   Number of Visits 8   Date for PT Re-Evaluation 02/19/17   PT Start Time 0737   PT Stop Time 0825   PT Time Calculation (min) 48 min   Activity Tolerance Patient tolerated treatment well;Patient limited by pain   Behavior During Therapy [facility] for tasks assessed/performed      Past Medical History:  Diagnosis Date  . Complication of anesthesia   . Hematuria, microscopic    occ ,followed by urologist  . Inguinal hernia    right  . PONV (postoperative nausea and vomiting)     Past Surgical History:  Procedure Laterality Date  . HERNIA REPAIR  03/27/11   right  . INGUINAL HERNIA REPAIR Bilateral 01/04/2015   Procedure: LAPAROSCOPIC BILATERAL INGUINAL HERNIA REPAIR WITH MESH;  Surgeon: Erroll Luna, MD;  Location: [place];  Service: General;  Laterality: Bilateral;  . INSERTION OF MESH Bilateral 01/04/2015   Procedure: INSERTION OF MESH;  Surgeon: Erroll Luna, MD;  Location: [place];  Service: General;  Laterality: Bilateral;  . LEG SURGERY     bilateral  . WISDOM TOOTH EXTRACTION      There were no vitals filed for this visit.      Subjective Assessment - 01/22/17 0749    Subjective Patient has been sick the past couple of weeks and has not been to therapy due to it. He continues to have back and B wrist pain that is affecting his sleeping patterns. His back pain is getting better he reports but still affecting his life.  Pt. reports slight improvement in back pain with trial TENS but states he felt weird in heart.  PT recommends holding off of estim at this time.     Pertinent History hx of back pain    Limitations Sitting;Lifting;Standing;Walking;House hold activities;Other (comment)   How long can you sit comfortably? frequently requires position change   How long can you stand comfortably? frequently requires position change   Patient Stated Goals decreased pain   Currently in Pain? Yes   Pain Score 4    Pain Location Back   Pain Orientation Mid;Lower   Pain Descriptors / Indicators Aching;Constant   Pain Type Chronic pain   Pain Onset More than a month ago   Pain Score 5   Pain Location Wrist   Pain Orientation Right;Left   Pain Descriptors / Indicators Discomfort;Spasm     Manual  Prone grade I CPAs tender to thoracolumbar spine to (sacral to T8 tender) with 4x30 seconds each level Prone Grade I-III UPAs. Right not tender except L4-5, Left tender in thoracic region STM to paraspinals Hamstring stretch  TherEx Seated rotation stretch 2x each side 30 second hold Back ROM to assess pain: pain with flexion and side bending Lower extremity MMT Grip strength hand putty green: fist motion in multiple planes of thumb and pincher/key grip multiple planes of motion. Wrist ulnar deviation red theraband 5x each wrist, no pain Seated extension stretch 2x10 seconds Standing posture exercise at doorway 4x 8 second holds Thoracic slump stretch 2x30 seconds Pt. Response to medical necessity: Pt. Will benefit form physical therapy  to decrease pain, improve mobility, and improve posture for return to prior level of function.        PT Education - 01/22/17 1221    Education provided Yes   Education Details new HEP   Person(s) Educated Patient   Methods Explanation;Demonstration;Handout   Comprehension Verbalized understanding;Returned demonstration             PT Long Term Goals - 01/22/17 1232      PT LONG TERM GOAL #1   Title Patient will score <15% on the QuickDash to increase patient's ability to perform activities of daily living.    Baseline Patient scored a 27% on 2/6.  3/20 38.64   Time 4   Period Weeks   Status On-going     PT LONG TERM GOAL #2   Title Patient will score <30% (self-perceived moderate disability) on the Modified Oswestry for more pain control during work.    Baseline Patient scored a 44% (severe percieved disability) on 2/6. 3/20: 44%    Time 4   Period Weeks   Status New     PT LONG TERM GOAL #3   Title Patient will be I in HEP to increase bilateral wrist extension MMT to 4+/5 to allow more functional strength for daily living activities and work.    Baseline Patient has 4+/5 wrist exension MMT bilaterally with pain   Time 4   Period Weeks   Status Partially Met     PT LONG TERM GOAL #4   Title Patient will maintain static position/ posture in sitting for 10 minutes without need to reposition for pain alleviation to improve work tasks.    Baseline Patient can tolerate positions for longer than 20 minutes   Time 4   Period Weeks   Status Achieved     PT LONG TERM GOAL #5   Title Patient will tolerate Grade II CPA mobilization to thoracolumbar region for improved mobility to decrease pain while driving.    Baseline does not tolerate Grade I CPAs   Time 4   Period Weeks   Status New            Plan - 01/22/17 1232    Clinical Impression Statement Patient presents to therapy after being absent for three weeks for illness. Pt. has tight musculature/paraspinals in the thoracolumbar region. Hypomobility of spine noted with tenderness of CPAs from sacrum to T8. Left UPA's tender in thoracic region, Right UPA's not tender in any region except L4-5. Full LE strength tested and motion of trunk is full with pain in side bending and extension. Wrist pain is bilateral with laxity upon lateral glides with slight discomfort/pain. Pain in excessive flexion and extension also noted. Grip strength: R 46#, L 37# with no pain. MODI: 44 % self-perceived severe disability, Quickdash: 38.64%.  PT updated pt. HEP to focus on increase mobility/  strengthening.  Patient will benefit from skilled physical therapy to decrease pain during work and improve mobility for return to daily living.    Rehab Potential Fair   Clinical Impairments Affecting Rehab Potential hx of back pain   PT Frequency 1x / week   PT Duration 4 weeks   PT Treatment/Interventions ADLs/Self Care Home Management;Electrical Stimulation;Iontophoresis '4mg'$ /ml Dexamethasone;Moist Heat;Ultrasound;Gait training;Functional mobility training;Therapeutic activities;Therapeutic exercise;Balance training;Neuromuscular re-education;Patient/family education;Manual techniques;Passive range of motion;Dry needling;Energy conservation;Splinting;Taping   PT Next Visit Plan review HEP   PT Home Exercise Plan see handout   Consulted and Agree with Plan of Care Patient  Patient will benefit from skilled therapeutic intervention in order to improve the following deficits and impairments:  Decreased activity tolerance, Decreased endurance, Decreased mobility, Decreased strength, Impaired flexibility, Increased muscle spasms, Impaired perceived functional ability, Postural dysfunction, Improper body mechanics, Pain  Visit Diagnosis: Chronic bilateral low back pain without sciatica  Pain in left wrist  Pain in right wrist  Abnormal posture  Muscle weakness (generalized)     Problem List Patient Active Problem List   Diagnosis Date Noted  . Bilateral wrist pain 11/27/2016  . Back pain 11/27/2016  . Hematuria, microscopic 08/21/2016  . Lumbago 08/13/2016  . Vitamin D deficiency 05/19/2016  . Elevated uric acid in blood 05/19/2016  . Benign mole 05/16/2016  . Arthralgia 05/15/2016  . Inguinodynia 12/27/2011  . INGUINAL HERNIA, RIGHT 01/30/2011   Pura Spice, PT, DPT # [id number], SPT 01/23/2017, 8:04 AM  [place] [facility] [address] [place], [place], [address] Phone: [phone]   Fax:  [phone]  Name:  Roger Marquez MRN: [id number] Date of Birth: 07-Dec-1985

## 2017-01-23 NOTE — Addendum Note (Signed)
Addended by: Pura Spice on: 01/23/2017 08:15 AM   Modules accepted: Orders

## 2017-01-29 ENCOUNTER — Encounter: Payer: Self-pay | Admitting: Physical Therapy

## 2017-01-29 ENCOUNTER — Ambulatory Visit: Payer: BLUE CROSS/BLUE SHIELD | Admitting: Physical Therapy

## 2017-01-29 DIAGNOSIS — M545 Low back pain, unspecified: Secondary | ICD-10-CM

## 2017-01-29 DIAGNOSIS — G8929 Other chronic pain: Secondary | ICD-10-CM

## 2017-01-29 DIAGNOSIS — M25531 Pain in right wrist: Secondary | ICD-10-CM

## 2017-01-29 DIAGNOSIS — M25532 Pain in left wrist: Secondary | ICD-10-CM

## 2017-01-29 DIAGNOSIS — M6281 Muscle weakness (generalized): Secondary | ICD-10-CM

## 2017-01-29 DIAGNOSIS — R293 Abnormal posture: Secondary | ICD-10-CM

## 2017-01-29 NOTE — Therapy (Signed)
[place] [facility] [address] [place], [place], [address] Phone: [phone]   Fax:  [phone]  Physical Therapy Treatment  Patient Details  Name: Roger Marquez MRN: [id number] Date of Birth: Jun 23, 1986 Referring Provider: Lynne Leader, MD  Encounter Date: 01/29/2017      PT End of Session - 01/29/17 0823    Visit Number 5   Number of Visits 8   Date for PT Re-Evaluation 02/19/17   PT Start Time 0729   PT Stop Time 0818   PT Time Calculation (min) 49 min   Activity Tolerance Patient tolerated treatment well;Patient limited by pain   Behavior During Therapy [facility] for tasks assessed/performed      Past Medical History:  Diagnosis Date  . Complication of anesthesia   . Hematuria, microscopic    occ ,followed by urologist  . Inguinal hernia    right  . PONV (postoperative nausea and vomiting)     Past Surgical History:  Procedure Laterality Date  . HERNIA REPAIR  03/27/11   right  . INGUINAL HERNIA REPAIR Bilateral 01/04/2015   Procedure: LAPAROSCOPIC BILATERAL INGUINAL HERNIA REPAIR WITH MESH;  Surgeon: Erroll Luna, MD;  Location: [place];  Service: General;  Laterality: Bilateral;  . INSERTION OF MESH Bilateral 01/04/2015   Procedure: INSERTION OF MESH;  Surgeon: Erroll Luna, MD;  Location: [place];  Service: General;  Laterality: Bilateral;  . LEG SURGERY     bilateral  . WISDOM TOOTH EXTRACTION      There were no vitals filed for this visit.      Subjective Assessment - 01/29/17 0821    Subjective Patient has been doing the HEP and is feeling some relief but continues to have some pain. Pt. has recently started sleeping on his stomach in the past week which is a new sleeping position for him.    Pertinent History hx of back pain   Limitations Sitting;Lifting;Standing;Walking;House hold activities;Other (comment)   How long can you sit comfortably? frequently requires position change   How long can you stand  comfortably? frequently requires position change   Patient Stated Goals decreased pain   Currently in Pain? Yes   Pain Score 3    Pain Location Back   Pain Orientation Mid;Lower   Pain Descriptors / Indicators Aching   Pain Onset More than a month ago     Manual: Grade I-III CPAs, UPAs to thoracolumbar spine, tenderness with Grade III but tolerated.  STM to paraspinals and piriformis Hamstring stretch 1x60 seconds with overpressure of df. Popliteal angle stretch 1x60 seconds, IT band stretch 1x30 seconds, Piriformis stretch 1x60 seconds supine, hip flexor stretch 1x60 seconds each side  TherEx Supine TA contractions with and without Swiss Ball 10x5 seconds Seated TA contractions with and without Swiss ball 10x5 seconds Seated UE marches with TA contraction 10x Seated piriformis stretch 2x60 seconds Seated hamstring stretch with df 2x30 seconds   . Pt. will return in two weeks for continued physical therapy to decrease pain during work and improve mobility for return to daily living.        PT Education - 01/29/17 [phone]    Education provided Yes   Education Details adding piriformis stretch to HEP, importance of stretching everday   Person(s) Educated Patient   Methods Explanation;Demonstration;Handout   Comprehension Verbalized understanding;Returned demonstration             PT Long Term Goals - 01/22/17 1232      PT LONG  TERM GOAL #1   Title Patient will score <15% on the QuickDash to increase patient's ability to perform activities of daily living.    Baseline Patient scored a 27% on 2/6. 3/20 38.64   Time 4   Period Weeks   Status On-going     PT LONG TERM GOAL #2   Title Patient will score <30% (self-perceived moderate disability) on the Modified Oswestry for more pain control during work.    Baseline Patient scored a 44% (severe percieved disability) on 2/6. 3/20: 44%    Time 4   Period Weeks   Status New     PT LONG TERM GOAL #3   Title Patient will be  I in HEP to increase bilateral wrist extension MMT to 4+/5 to allow more functional strength for daily living activities and work.    Baseline Patient has 4+/5 wrist exension MMT bilaterally with pain   Time 4   Period Weeks   Status Partially Met     PT LONG TERM GOAL #4   Title Patient will maintain static position/ posture in sitting for 10 minutes without need to reposition for pain alleviation to improve work tasks.    Baseline Patient can tolerate positions for longer than 20 minutes   Time 4   Period Weeks   Status Achieved     PT LONG TERM GOAL #5   Title Patient will tolerate Grade II CPA mobilization to thoracolumbar region for improved mobility to decrease pain while driving.    Baseline does not tolerate Grade I CPAs   Time 4   Period Weeks   Status New            Plan - 01/29/17 2703    Clinical Impression Statement Patient is tender to CPA's and UPAs of thoracolumbar spine with improved tolerance of Grade I-III. Hypomobility of spine improved with repetition. STM to piriformis referred to back with decreased sx. Pt. educated on piriformis stretch and performing TA contractions in seated position replicating driving position for work for proper spinal alignment. Pt. will return in two weeks for continued physical therapy to decrease pain during work and improve mobility for return to daily living.    Rehab Potential Fair   Clinical Impairments Affecting Rehab Potential hx of back pain   PT Frequency 1x / week   PT Duration 4 weeks   PT Treatment/Interventions ADLs/Self Care Home Management;Electrical Stimulation;Iontophoresis '4mg'$ /ml Dexamethasone;Moist Heat;Ultrasound;Gait training;Functional mobility training;Therapeutic activities;Therapeutic exercise;Balance training;Neuromuscular re-education;Patient/family education;Manual techniques;Passive range of motion;Dry needling;Energy conservation;Splinting;Taping   PT Next Visit Plan STM, piriformis, review HEP, Grade  I-III mobilizations   PT Home Exercise Plan see handout   Consulted and Agree with Plan of Care Patient      Patient will benefit from skilled therapeutic intervention in order to improve the following deficits and impairments:  Decreased activity tolerance, Decreased endurance, Decreased mobility, Decreased strength, Impaired flexibility, Increased muscle spasms, Impaired perceived functional ability, Postural dysfunction, Improper body mechanics, Pain  Visit Diagnosis: Chronic bilateral low back pain without sciatica  Pain in left wrist  Pain in right wrist  Abnormal posture  Muscle weakness (generalized)     Problem List Patient Active Problem List   Diagnosis Date Noted  . Bilateral wrist pain 11/27/2016  . Back pain 11/27/2016  . Hematuria, microscopic 08/21/2016  . Lumbago 08/13/2016  . Vitamin D deficiency 05/19/2016  . Elevated uric acid in blood 05/19/2016  . Benign mole 05/16/2016  . Arthralgia 05/15/2016  . Inguinodynia 12/27/2011  .  INGUINAL HERNIA, RIGHT 01/30/2011   Pura Spice, PT, DPT # [id number], SPT 01/29/2017, 8:34 AM  [place] [facility] [address] [place], [place], [address] Phone: [phone]   Fax:  [phone]  Name: Roger Marquez MRN: [id number] Date of Birth: 03-14-1986

## 2017-02-12 ENCOUNTER — Ambulatory Visit: Payer: BLUE CROSS/BLUE SHIELD | Attending: Family Medicine | Admitting: Physical Therapy

## 2017-02-12 ENCOUNTER — Encounter: Payer: Self-pay | Admitting: Physical Therapy

## 2017-02-12 DIAGNOSIS — R293 Abnormal posture: Secondary | ICD-10-CM

## 2017-02-12 DIAGNOSIS — M6281 Muscle weakness (generalized): Secondary | ICD-10-CM | POA: Diagnosis present

## 2017-02-12 DIAGNOSIS — M25532 Pain in left wrist: Secondary | ICD-10-CM | POA: Diagnosis present

## 2017-02-12 DIAGNOSIS — G8929 Other chronic pain: Secondary | ICD-10-CM | POA: Insufficient documentation

## 2017-02-12 DIAGNOSIS — M25531 Pain in right wrist: Secondary | ICD-10-CM | POA: Insufficient documentation

## 2017-02-12 DIAGNOSIS — M545 Low back pain: Secondary | ICD-10-CM | POA: Insufficient documentation

## 2017-02-13 NOTE — Therapy (Addendum)
[facility] [address] [place], [place], [address] Phone: [phone]   Fax:  [phone]  Physical Therapy Treatment  Patient Details  Name: Roger Marquez MRN: [id number] Date of Birth: 25-Oct-1986 Referring Provider: Lynne Leader, MD  Encounter Date: 02/12/2017      PT End of Session - 02/13/17 1736    Visit Number 6   Number of Visits 8   Date for PT Re-Evaluation 02/19/17   PT Start Time 0731   PT Stop Time 0822   PT Time Calculation (min) 51 min   Activity Tolerance Patient tolerated treatment well;Patient limited by pain   Behavior During Therapy [facility] for tasks assessed/performed      Past Medical History:  Diagnosis Date  . Complication of anesthesia   . Hematuria, microscopic    occ ,followed by urologist  . Inguinal hernia    right  . PONV (postoperative nausea and vomiting)     Past Surgical History:  Procedure Laterality Date  . HERNIA REPAIR  03/27/11   right  . INGUINAL HERNIA REPAIR Bilateral 01/04/2015   Procedure: LAPAROSCOPIC BILATERAL INGUINAL HERNIA REPAIR WITH MESH;  Surgeon: Erroll Luna, MD;  Location: [place];  Service: General;  Laterality: Bilateral;  . INSERTION OF MESH Bilateral 01/04/2015   Procedure: INSERTION OF MESH;  Surgeon: Erroll Luna, MD;  Location: [place];  Service: General;  Laterality: Bilateral;  . LEG SURGERY     bilateral  . WISDOM TOOTH EXTRACTION      There were no vitals filed for this visit.      Subjective Assessment - 02/12/17 1555    Subjective Pt. reports continued soreness in back and R forearm/wrist today.     Pertinent History hx of back pain   Limitations Sitting;Lifting;Standing;Walking;House hold activities;Other (comment)   How long can you sit comfortably? frequently requires position change   How long can you stand comfortably? frequently requires position change   Patient Stated Goals decreased pain   Currently in Pain? Yes   Pain Score 2    Pain  Location Back   Pain Orientation Mid;Lower      Manual: Grade I-III CPAs, UPAs to thoracolumbar spine, tenderness with Grade III but tolerated.  Prone STM to paraspinals and piriformis Supine/ seated hamstring stretch 1x60 seconds with overpressure of df. Popliteal angle stretch 1x60 seconds, IT band stretch 1x30 seconds, Piriformis stretch 1x60 seconds supine, hip flexor stretch 1x60 seconds each side  TherEx Reviewed HEP in depth.  Discussed use of compression sleeve to R wrist/forearm while driving.     Pt. Benefits from skilled PT to increase muscle flexibility/ pain mgmt. With work related tasks.      PT encouraged pt. to continue with back/forearm stretching program on a regular basis and discussed benefits of icing for pain mgmt/ decrease inflammation.  Pt. may benefit from forearm compression sleeve to manage inflammation/pain during repetitive tasks/ driving.  Pt. continues to present with generalized back soft tissue tenderness with STM/ mobilizations.  PT will reassess goals next tx. session to determine POC.  Pt. may benefit from return MD for further diagnostic testing/follow-up if symptoms persist.          PT Long Term Goals - 01/22/17 1232      PT LONG TERM GOAL #1   Title Patient will score <15% on the QuickDash to increase patient's ability to perform activities of daily living.    Baseline Patient scored a 27% on 2/6. 3/20 38.64  Time 4   Period Weeks   Status On-going     PT LONG TERM GOAL #2   Title Patient will score <30% (self-perceived moderate disability) on the Modified Oswestry for more pain control during work.    Baseline Patient scored a 44% (severe percieved disability) on 2/6. 3/20: 44%    Time 4   Period Weeks   Status New     PT LONG TERM GOAL #3   Title Patient will be I in HEP to increase bilateral wrist extension MMT to 4+/5 to allow more functional strength for daily living activities and work.    Baseline Patient has 4+/5 wrist  exension MMT bilaterally with pain   Time 4   Period Weeks   Status Partially Met     PT LONG TERM GOAL #4   Title Patient will maintain static position/ posture in sitting for 10 minutes without need to reposition for pain alleviation to improve work tasks.    Baseline Patient can tolerate positions for longer than 20 minutes   Time 4   Period Weeks   Status Achieved     PT LONG TERM GOAL #5   Title Patient will tolerate Grade II CPA mobilization to thoracolumbar region for improved mobility to decrease pain while driving.    Baseline does not tolerate Grade I CPAs   Time 4   Period Weeks   Status New           Plan - 02/13/17 1736    Rehab Potential Fair   Clinical Impairments Affecting Rehab Potential hx of back pain   PT Frequency 1x / week   PT Treatment/Interventions ADLs/Self Care Home Management;Electrical Stimulation;Iontophoresis '4mg'$ /ml Dexamethasone;Moist Heat;Ultrasound;Gait training;Functional mobility training;Therapeutic activities;Therapeutic exercise;Balance training;Neuromuscular re-education;Patient/family education;Manual techniques;Passive range of motion;Dry needling;Energy conservation;Splinting;Taping   PT Next Visit Plan STM, piriformis, review HEP, Grade I-III mobilizations.  RECERT vs. DISCHARGE next tx. session.     PT Home Exercise Plan see handout   Consulted and Agree with Plan of Care Patient      Patient will benefit from skilled therapeutic intervention in order to improve the following deficits and impairments:  Decreased activity tolerance, Decreased endurance, Decreased mobility, Decreased strength, Impaired flexibility, Increased muscle spasms, Impaired perceived functional ability, Postural dysfunction, Improper body mechanics, Pain  Visit Diagnosis: Chronic bilateral low back pain without sciatica  Pain in left wrist  Pain in right wrist  Abnormal posture  Muscle weakness (generalized)     Problem List Patient Active Problem  List   Diagnosis Date Noted  . Bilateral wrist pain 11/27/2016  . Back pain 11/27/2016  . Hematuria, microscopic 08/21/2016  . Lumbago 08/13/2016  . Vitamin D deficiency 05/19/2016  . Elevated uric acid in blood 05/19/2016  . Benign mole 05/16/2016  . Arthralgia 05/15/2016  . Inguinodynia 12/27/2011  . INGUINAL HERNIA, RIGHT 01/30/2011   Pura Spice, PT, DPT # [id number] 02/13/2017, 5:37 PM  [place] [facility] [address] [place], [place], [address] Phone: [phone]   Fax:  [phone]  Name: Roger Marquez MRN: [id number] Date of Birth: [birth year]

## 2017-02-19 ENCOUNTER — Ambulatory Visit: Payer: BLUE CROSS/BLUE SHIELD | Admitting: Physical Therapy

## 2017-02-19 ENCOUNTER — Encounter: Payer: Self-pay | Admitting: Physical Therapy

## 2017-02-19 DIAGNOSIS — M545 Low back pain: Principal | ICD-10-CM

## 2017-02-19 DIAGNOSIS — R293 Abnormal posture: Secondary | ICD-10-CM

## 2017-02-19 DIAGNOSIS — M25531 Pain in right wrist: Secondary | ICD-10-CM

## 2017-02-19 DIAGNOSIS — G8929 Other chronic pain: Secondary | ICD-10-CM

## 2017-02-19 DIAGNOSIS — M6281 Muscle weakness (generalized): Secondary | ICD-10-CM

## 2017-02-19 DIAGNOSIS — M25532 Pain in left wrist: Secondary | ICD-10-CM

## 2017-02-19 NOTE — Therapy (Signed)
[facility] [address] [place], [place], [address] Phone: [phone]   Fax:  [phone]  Physical Therapy Treatment  Patient Details  Name: Roger Marquez MRN: [id number] Date of Birth: November 06, 1985 Referring Provider: Lynne Leader, MD  Encounter Date: 02/19/2017      PT End of Session - 02/19/17 0918    Visit Number 7   Number of Visits 8   Date for PT Re-Evaluation 02/19/17   PT Start Time 0726   PT Stop Time 0816   PT Time Calculation (min) 50 min   Activity Tolerance Patient tolerated treatment well;Patient limited by pain   Behavior During Therapy [facility] for tasks assessed/performed      Past Medical History:  Diagnosis Date  . Complication of anesthesia   . Hematuria, microscopic    occ ,followed by urologist  . Inguinal hernia    right  . PONV (postoperative nausea and vomiting)     Past Surgical History:  Procedure Laterality Date  . HERNIA REPAIR  03/27/11   right  . INGUINAL HERNIA REPAIR Bilateral 01/04/2015   Procedure: LAPAROSCOPIC BILATERAL INGUINAL HERNIA REPAIR WITH MESH;  Surgeon: Erroll Luna, MD;  Location: [place];  Service: General;  Laterality: Bilateral;  . INSERTION OF MESH Bilateral 01/04/2015   Procedure: INSERTION OF MESH;  Surgeon: Erroll Luna, MD;  Location: [place];  Service: General;  Laterality: Bilateral;  . LEG SURGERY     bilateral  . WISDOM TOOTH EXTRACTION      There were no vitals filed for this visit.      Subjective Assessment - 02/19/17 0728    Subjective Patient is having shooting wrist pain up into forearms now and has not acquired wrist wraps/compression wrap as requested at this time.  Back pain 4/10, wrist pain not felt unless working with them/using them so at rest about a 1/10.    Pertinent History hx of back pain   Limitations Sitting;Lifting;Standing;Walking;House hold activities;Other (comment)   How long can you sit comfortably? frequently requires position change   How long can you stand comfortably? frequently requires position change   Patient Stated Goals decreased pain   Currently in Pain? Yes   Pain Score 4    Pain Location Back   Pain Orientation Mid;Lower   Pain Descriptors / Indicators Aching   Pain Type Chronic pain   Multiple Pain Sites Yes   Pain Score 1   Pain Location Wrist   Pain Orientation Right;Left   Pain Descriptors / Indicators Aching   Pain Type Chronic pain   Pain Radiating Towards forearms   Pain Frequency Intermittent   Aggravating Factors  use, gripping object     Manual  Grade I-II CPAs, UPAs to thoracolumbar spine, tenderness with Grade II CPA and R UPAs. Supine/ seated hamstring stretch 1x60 seconds with overpressure of df. Popliteal angle stretch 1x60 seconds, IT band stretch 1x30 seconds, Piriformis stretch 1x60 seconds supine, hip flexor stretch 1x60 seconds each side  TherEx Scapular retractions in seated with towel b/w shoulder blades Thoracic extensions with towel rolled and placed at lumbar spine Supine TA contractions with Swiss Ball,  TA contraction with Swiss ball and UE dead bugx10 TA contraction with swiss ball and LE slides x10 GTB: 90 90 rows 15x, straight arm rows 15x  Grip strength: L 31.4, R: 27.8 MODI: 38% QuickDash: 41%  Patient will return to MD for reassessment due to limited progress in therapy and continuation of pain limiting mobility.  PT Education - 02/19/17 [phone]    Education provided Yes   Education Details returning back to doctor, utilizing wrist wraps/bracing for pain relief   Person(s) Educated Patient   Methods Explanation   Comprehension Verbalized understanding             PT Long Term Goals - 02/19/17 1020      PT LONG TERM GOAL #1   Title Patient will score <15% on the QuickDash to increase patient's ability to perform activities of daily living.    Baseline Patient scored a 27% on 2/6. 3/20 38.64 4/17: 41%   Time 4   Period Weeks   Status Not  Met     PT LONG TERM GOAL #2   Title Patient will score <30% (self-perceived moderate disability) on the Modified Oswestry for more pain control during work.    Baseline Patient scored a 44% (severe percieved disability) on 2/6. 3/20: 44% 4/17:38%   Time 4   Period Weeks   Status Partially Met     PT LONG TERM GOAL #3   Title Patient will be I in HEP to increase bilateral wrist extension MMT to 4+/5 to allow more functional strength for daily living activities and work.    Baseline 4/17;Bilateral 4+/5 wrist extension MMT with pain   Time 4   Period Weeks   Status Achieved     PT LONG TERM GOAL #4   Title Patient will maintain static position/ posture in sitting for 10 minutes without need to reposition for pain alleviation to improve work tasks.    Baseline Patient can tolerate positions for longer than 20 minutes   Time 4   Period Weeks   Status Achieved     PT LONG TERM GOAL #5   Title Patient will tolerate Grade II CPA mobilization to thoracolumbar region for improved mobility to decrease pain while driving.    Baseline 4/17 Tolerates grade I but not Grade II CPA's   Time 4   Period Weeks   Status Partially Met            Plan - 02/19/17 5465    Clinical Impression Statement Patient is continuing to have pain limited mobility and will benefit from returning back to MD for follow up/diagnosis testing. MODI=38%, QuickDash 41%. Patient is still tender with grade I-II CPA and R UPA mobilizations to thoracolumbar spine.  Patient may benefit from forearm and wrist compression sleeves for pain management of wrists while driving and was educated on fit/use. Patient performed postural exercises with tactile cueing required for proper body mechanics. At this time patient will benefit from follow up with MD for reassessment.    Rehab Potential Fair   Clinical Impairments Affecting Rehab Potential hx of back pain   PT Frequency 1x / week   PT Treatment/Interventions ADLs/Self Care  Home Management;Electrical Stimulation;Iontophoresis 47m/ml Dexamethasone;Moist Heat;Ultrasound;Gait training;Functional mobility training;Therapeutic activities;Therapeutic exercise;Balance training;Neuromuscular re-education;Patient/family education;Manual techniques;Passive range of motion;Dry needling;Energy conservation;Splinting;Taping   PT Next Visit Plan call in 2 weeks to see what doctor said    PT Home Exercise Plan see handout   Consulted and Agree with Plan of Care Patient      Patient will benefit from skilled therapeutic intervention in order to improve the following deficits and impairments:  Decreased activity tolerance, Decreased endurance, Decreased mobility, Decreased strength, Impaired flexibility, Increased muscle spasms, Impaired perceived functional ability, Postural dysfunction, Improper body mechanics, Pain  Visit Diagnosis: Chronic bilateral low back pain without sciatica  Pain in left  wrist  Pain in right wrist  Abnormal posture  Muscle weakness (generalized)     Problem List Patient Active Problem List   Diagnosis Date Noted  . Bilateral wrist pain 11/27/2016  . Back pain 11/27/2016  . Hematuria, microscopic 08/21/2016  . Lumbago 08/13/2016  . Vitamin D deficiency 05/19/2016  . Elevated uric acid in blood 05/19/2016  . Benign mole 05/16/2016  . Arthralgia 05/15/2016  . Inguinodynia 12/27/2011  . INGUINAL HERNIA, RIGHT 01/30/2011   Pura Spice, PT, DPT # [id number] Janna Arch, SPT 02/19/2017, 11:27 AM  [facility] [address], [place], [address] Phone: [phone]   Fax:  [phone]  Name: Roger Marquez MRN: [id number] Date of Birth: 07-22-1986

## 2017-03-12 ENCOUNTER — Ambulatory Visit (INDEPENDENT_AMBULATORY_CARE_PROVIDER_SITE_OTHER): Payer: BLUE CROSS/BLUE SHIELD

## 2017-03-12 ENCOUNTER — Ambulatory Visit (INDEPENDENT_AMBULATORY_CARE_PROVIDER_SITE_OTHER): Payer: BLUE CROSS/BLUE SHIELD | Admitting: Family Medicine

## 2017-03-12 ENCOUNTER — Encounter: Payer: Self-pay | Admitting: Family Medicine

## 2017-03-12 VITALS — BP 136/87 | HR 87 | Ht 67.0 in | Wt 172.5 lb

## 2017-03-12 DIAGNOSIS — M546 Pain in thoracic spine: Secondary | ICD-10-CM

## 2017-03-12 DIAGNOSIS — J029 Acute pharyngitis, unspecified: Secondary | ICD-10-CM

## 2017-03-12 DIAGNOSIS — M545 Low back pain, unspecified: Secondary | ICD-10-CM

## 2017-03-12 DIAGNOSIS — M25532 Pain in left wrist: Secondary | ICD-10-CM

## 2017-03-12 DIAGNOSIS — G8929 Other chronic pain: Secondary | ICD-10-CM | POA: Diagnosis not present

## 2017-03-12 DIAGNOSIS — M25531 Pain in right wrist: Secondary | ICD-10-CM | POA: Diagnosis not present

## 2017-03-12 LAB — POCT RAPID STREP A (OFFICE): Rapid Strep A Screen: NEGATIVE

## 2017-03-12 MED ORDER — CLINDAMYCIN HCL 300 MG PO CAPS: 300.0000 mg | ORAL_CAPSULE | Freq: Three times a day (TID) | ORAL | 0 refills | Status: DC

## 2017-03-12 MED ORDER — PREDNISONE 5 MG (48) PO TBPK: ORAL_TABLET | ORAL | 0 refills | Status: DC

## 2017-03-12 NOTE — Patient Instructions (Signed)
Thank you for coming in today. Use zyrtec.  Take prednisone  Take clindamycin.  Get xray today.  Recheck in 1-2 months.   We will work to figure this out.

## 2017-03-12 NOTE — Progress Notes (Signed)
Roger Marquez is a 31 y.o. male who presents to [facility] today for sore throat, back and wrist pain, allergies.  Sore Throat: Started on Thursday and gotten progressively worse.  He took some leftover amoxicillin 3xday 500 mg on sat, sun, mon; without relief.  He endorses difficulty swallowing, chills.  He denies trismus, drooling, sick contacts, cough.  Allergies: seasonal allergies have been bothering him over the last month or so.  Endorses post-nasal drip, headache.  Improves with sudafed, Flonase.   Hasn't tried any allergy medication.   Back Pain: Has reported parascapular back pain and was attending PT.  He doesn't think PT has helped and believes PT might have worsened.  Now the pain is located from the lumbar to cervical spine diffuse and bilaterally.  Worse in the am.    Bilateral wrist pain: feels pain and clicking in both wrists   Past Medical History:  Diagnosis Date  . Complication of anesthesia   . Hematuria, microscopic    occ ,followed by urologist  . Inguinal hernia    right  . PONV (postoperative nausea and vomiting)    Past Surgical History:  Procedure Laterality Date  . HERNIA REPAIR  03/27/11   right  . INGUINAL HERNIA REPAIR Bilateral 01/04/2015   Procedure: LAPAROSCOPIC BILATERAL INGUINAL HERNIA REPAIR WITH MESH;  Surgeon: Erroll Luna, MD;  Location: [place];  Service: General;  Laterality: Bilateral;  . INSERTION OF MESH Bilateral 01/04/2015   Procedure: INSERTION OF MESH;  Surgeon: Erroll Luna, MD;  Location: [place];  Service: General;  Laterality: Bilateral;  . LEG SURGERY     bilateral  . WISDOM TOOTH EXTRACTION     Social History  Substance Use Topics  . Smoking status: Never Smoker  . Smokeless tobacco: Never Used  . Alcohol use Yes     Comment: beer occ     ROS:  As above   Medications: Current Outpatient Prescriptions  Medication Sig Dispense Refill  . febuxostat (ULORIC) 40 MG tablet  Take 1 tablet (40 mg total) by mouth daily. Pt could not tolerate allopurinol 90 tablet 1  . omeprazole (PRILOSEC) 40 MG capsule Take 1 capsule (40 mg total) by mouth daily. 30 capsule 3  . clindamycin (CLEOCIN) 300 MG capsule Take 1 capsule (300 mg total) by mouth 3 (three) times daily. 21 capsule 0  . predniSONE (STERAPRED UNI-PAK 48 TAB) 5 MG (48) TBPK tablet 12 day dosepack po 48 tablet 0   No current facility-administered medications for this visit.    No Known Allergies   Exam:  BP 136/87   Pulse 87   Ht 5\' 7"  (1.702 m)   Wt 172 lb 8 oz (78.2 kg)   SpO2 98%   BMI 27.02 kg/m  General: Well Developed, well nourished, and in no acute distress.  Neuro/Psych: Alert and oriented x3, extra-ocular muscles intact, able to move all 4 extremities, sensation grossly intact. Skin: Warm and dry, no rashes noted.  HEENT: erythematous, white spots bilaterally Respiratory: Not using accessory muscles, speaking in full sentences, trachea midline.  Cardiovascular: Pulses palpable, no extremity edema. Abdomen: Does not appear distended. MSK:  Cervical spine: Nontender to midline normal motion Thoracic spine nontender to midline. Tender to palpation bilateral thoracic paraspinal muscles. Lumbar spine nontender to midline normal motions. Wrists bilaterally normal-appearing nontender. Palpable click with wrist motion. Grip strength intact.    Results for orders placed or performed in visit on 03/12/17 (from the past 48 hour(s))  POCT rapid strep A     Status: None   Collection Time: 03/12/17 11:36 AM  Result Value Ref Range   Rapid Strep A Screen Negative Negative   No results found.    Assessment and Plan: 31 y.o. male with tonsillitis, chronic bilateral low back pain, acute thoracic back pain, bilateral wrist pain and Pharyngitis.  Pharyngitis: Treat with clindamycin and prednisone course. Clindamycin since throat is refractory to amoxicillin and prior episodes of tonsillitis in past  treated with cefdinir.  Rapid strep test negative unsurprisingly given abx use.  Consider culture before abx if infection recurs.  As for back pain: The etiology is unclear. Patient has had a trial of physical therapy and fell. X-rays of the thoracic and cervical spine are pending. Patient had a negative rheumatologic workup in July 2017. However he has diffuse pain with no clear etiology. This feels to me like it's a rheumatologic process although I don't have any abnormal labs to suggest this. We'll use a course of prednisone to see if it helps his pain. He does is more likely to be rheumatologic.   Wrist pain: Etiology unclear. If no improvement will consider MRI arthrogram of the wrist  Advised pt on OTC allergies meds when seasonal allergies flare      Orders Placed This Encounter  Procedures  . DG Cervical Spine Complete    Standing Status:   Future    Number of Occurrences:   1    Standing Expiration Date:   05/12/2018    Order Specific Question:   Reason for Exam (SYMPTOM  OR DIAGNOSIS REQUIRED)    Answer:   eval neck and back pain    Order Specific Question:   Preferred imaging location?    Answer:   [facility]    Order Specific Question:   Radiology Contrast Protocol - do NOT remove file path    Answer:   \\[email or web address]  . DG Thoracic Spine W/Swimmers    Standing Status:   Future    Number of Occurrences:   1    Standing Expiration Date:   05/12/2018    Order Specific Question:   Reason for Exam (SYMPTOM  OR DIAGNOSIS REQUIRED)    Answer:   eval pain    Order Specific Question:   Preferred imaging location?    Answer:   [facility]    Order Specific Question:   Radiology Contrast Protocol - do NOT remove file path    Answer:   \\[email or web address]\[email or web address].[email or web address]  . POCT rapid strep A    Discussed warning signs or symptoms. Please see discharge instructions. Patient expresses  understanding.

## 2017-04-04 HISTORY — PX: VASECTOMY: SHX75

## 2017-04-09 ENCOUNTER — Encounter: Payer: Self-pay | Admitting: Family Medicine

## 2017-04-14 ENCOUNTER — Encounter: Payer: Self-pay | Admitting: Family Medicine

## 2017-04-19 ENCOUNTER — Ambulatory Visit (INDEPENDENT_AMBULATORY_CARE_PROVIDER_SITE_OTHER): Payer: BLUE CROSS/BLUE SHIELD | Admitting: Osteopathic Medicine

## 2017-04-19 ENCOUNTER — Encounter: Payer: Self-pay | Admitting: Osteopathic Medicine

## 2017-04-19 VITALS — BP 128/88 | HR 73 | Temp 98.1°F | Ht 67.0 in | Wt 172.0 lb

## 2017-04-19 DIAGNOSIS — J309 Allergic rhinitis, unspecified: Secondary | ICD-10-CM

## 2017-04-19 DIAGNOSIS — R0981 Nasal congestion: Secondary | ICD-10-CM

## 2017-04-19 MED ORDER — FLUTICASONE PROPIONATE 50 MCG/ACT NA SUSP: 2.0000 | Freq: Every day | NASAL | 6 refills | Status: DC

## 2017-04-19 NOTE — Patient Instructions (Signed)
Plan:  STOP Sinex and other nasal sprays containing Oxymetolazone - these can cause severe rebound congestion if overused  Instead, will START Flonase nasal steroid for long-term treatment of allergies, and you can CONTINUE Sudafed and Zyrtec with this  CALL us if no better in the next 1-2 weeks, or sooner if worse.

## 2017-04-19 NOTE — Progress Notes (Signed)
HPI: Roger Marquez is a 31 y.o. male who presents to [facility] 04/19/17 for chief complaint of:  Chief Complaint  Patient presents with  . Sinus Problem   Sinus congestion: Ongoing for greater than a week. Previously seen by Dr. Georgina Snell for sinus/congestion issue about a month ago and was diagnosed with tonsillitis at that point. Sore throat has resolved. He states at that point Dr. Georgina Snell told him to take Zyrtec as well as Sudafed for sinus issues. He has been doing this but has also added nasal spray containing Oxymetazoline and has been taking this most days, notices congestion is helped briefly but then comes back.   Past medical, social and family history reviewed.  Immune compromising conditions or other risk factors: none  Current medications and allergies reviewed.     Review of Systems:  Constitutional: No  fever/chills  HEENT: sinus headache, No  sore throat, No  swollen glands  Cardiovascular: No chest pain  Respiratory:occasional cough, No  shortness of breath  Gastrointestinal: with postnasal drip there is some nausea, No  vomiting,  No  diarrhea    Detailed Exam:  BP 128/88   Pulse 73   Temp 98.1 F (36.7 C) (Oral)   Ht 5\' 7"  (1.702 m)   Wt 172 lb (78 kg)   BMI 26.94 kg/m   Constitutional:   VSS, see above.   General Appearance: alert, well-developed, well-nourished, NAD  Eyes:   Normal lids and conjunctive, non-icteric sclera  Ears, Nose, Mouth, Throat:   Normal external inspection ears/nares  Normal mouth/lips/gums, MMM  normal TM  posterior pharynx without erythema, without exudate  nasal mucosa normal  Skin:  Normal inspection, no rash or concerning lesions noted on limited exam  Neck:   No masses, trachea midline. normal lymph nodes  Respiratory:   Normal respiratory effort.   No  wheeze/rhonchi/rales  Cardiovascular:   S1/S2 normal, no murmur/rub/gallop auscultated.  RRR.    ASSESSMENT/PLAN:  Allergic rhinitis, unspecified seasonality, unspecified trigger  Congestion of nasal sinus  Patient also had question about back pain, states that she emailed Dr. Georgina Snell few days ago but hasn't heard anything back, will pass this along.    Patient Instructions  Plan:  STOP Sinex and other nasal sprays containing Oxymetolazone - these can cause severe rebound congestion if overused  Instead, will START Flonase nasal steroid for long-term treatment of allergies, and you can CONTINUE Sudafed and Zyrtec with this  CALL us if no better in the next 1-2 weeks, or sooner if worse.     Visit summary was printed for the patient with medications and pertinent instructions for patient to review. ER/RTC precautions reviewed. All questions answered. Return if symptoms worsen or fail to improve.

## 2017-09-27 IMAGING — DX DG LUMBAR SPINE COMPLETE 4+V
5 series · 5 of 5 positions shown · non-contrast
Comparison: None.

CLINICAL DATA: 30-year-old male with chronic low back and lumbar
spine pain.

EXAM:
LUMBAR SPINE - COMPLETE 4+ VIEW

[l-spine ap]
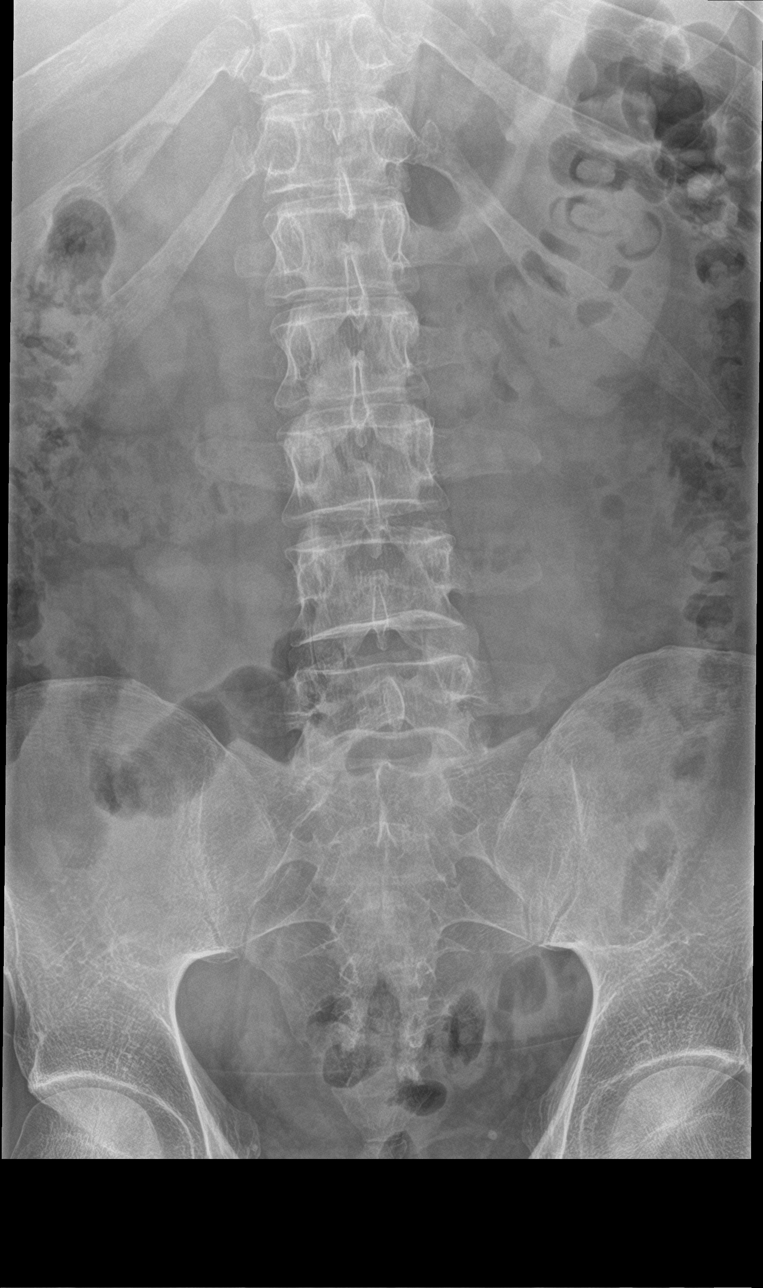

[l-spine obl (1 of 2)]
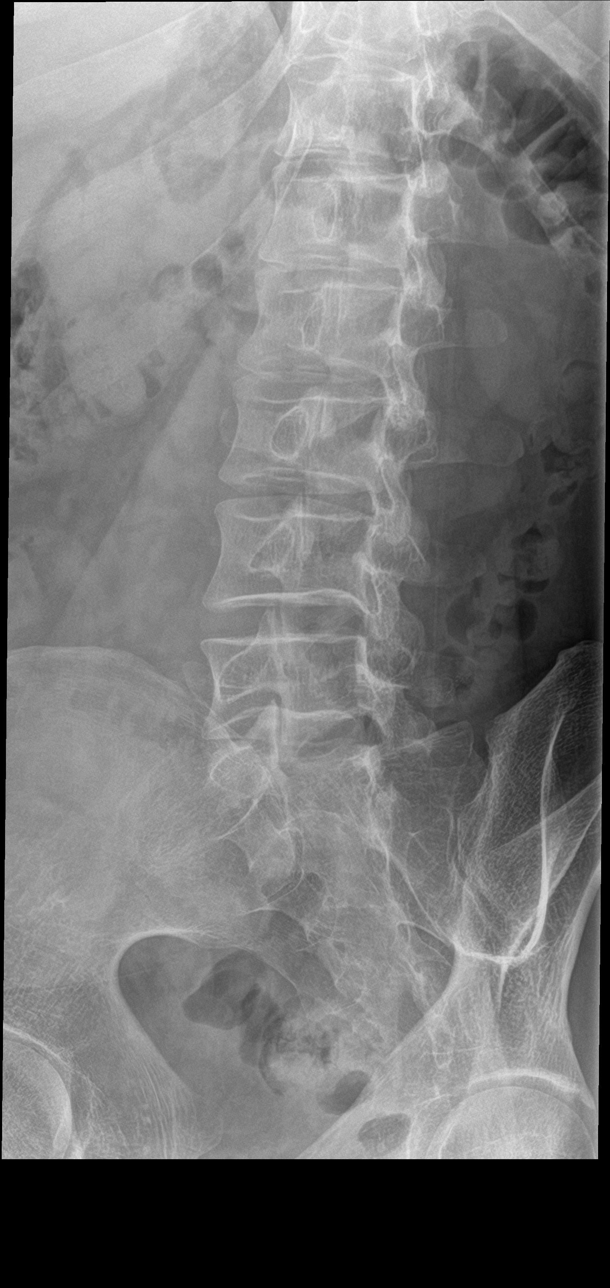

[l-spine obl (2 of 2)]
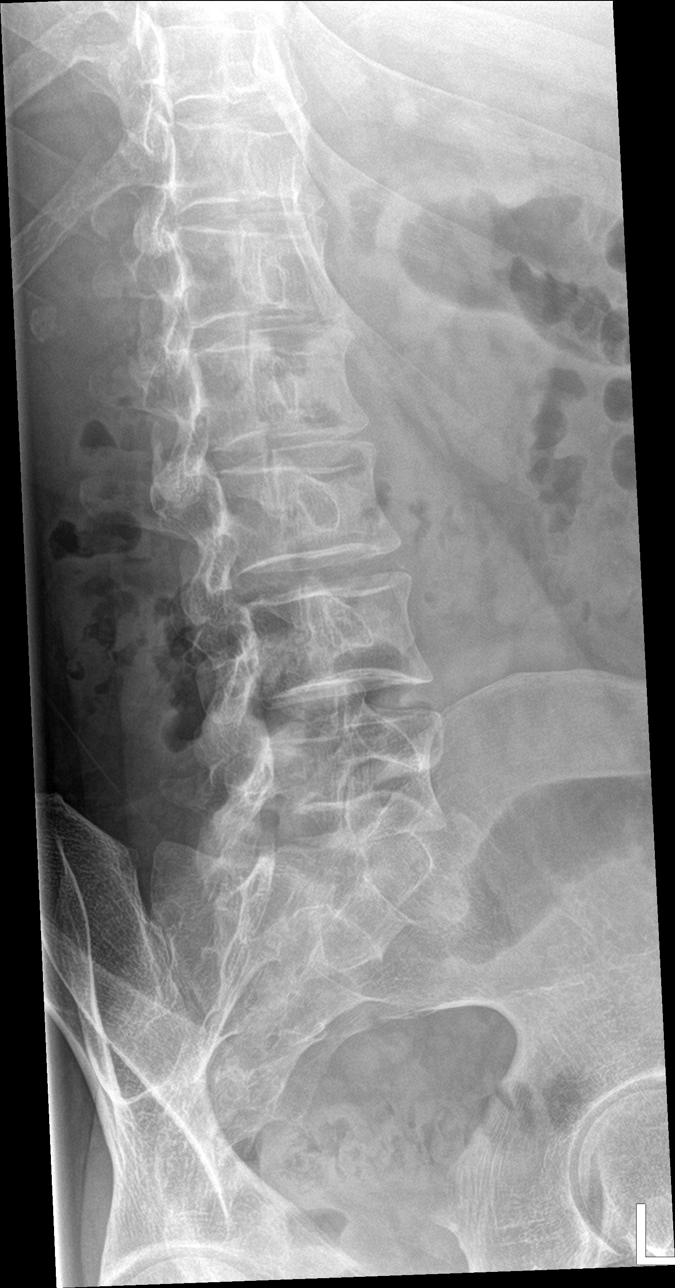

[l-spine lat]
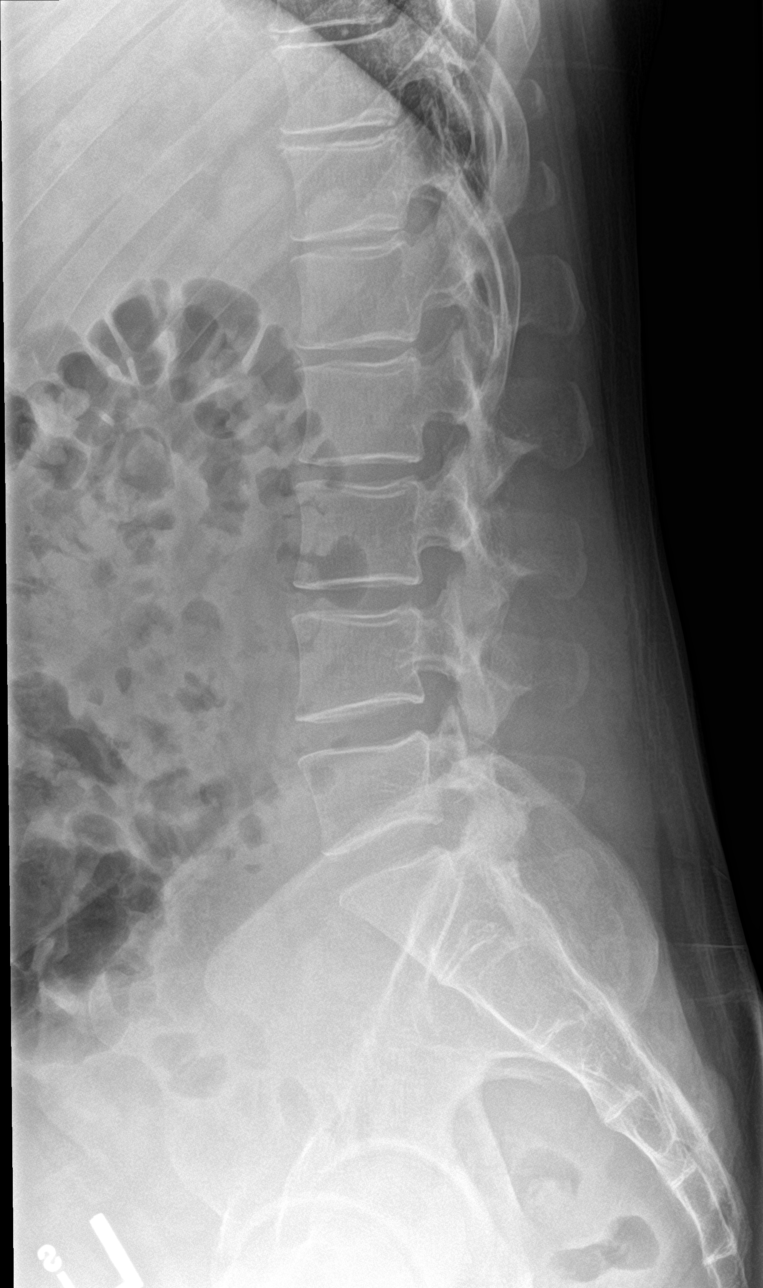

[l-spine spot]
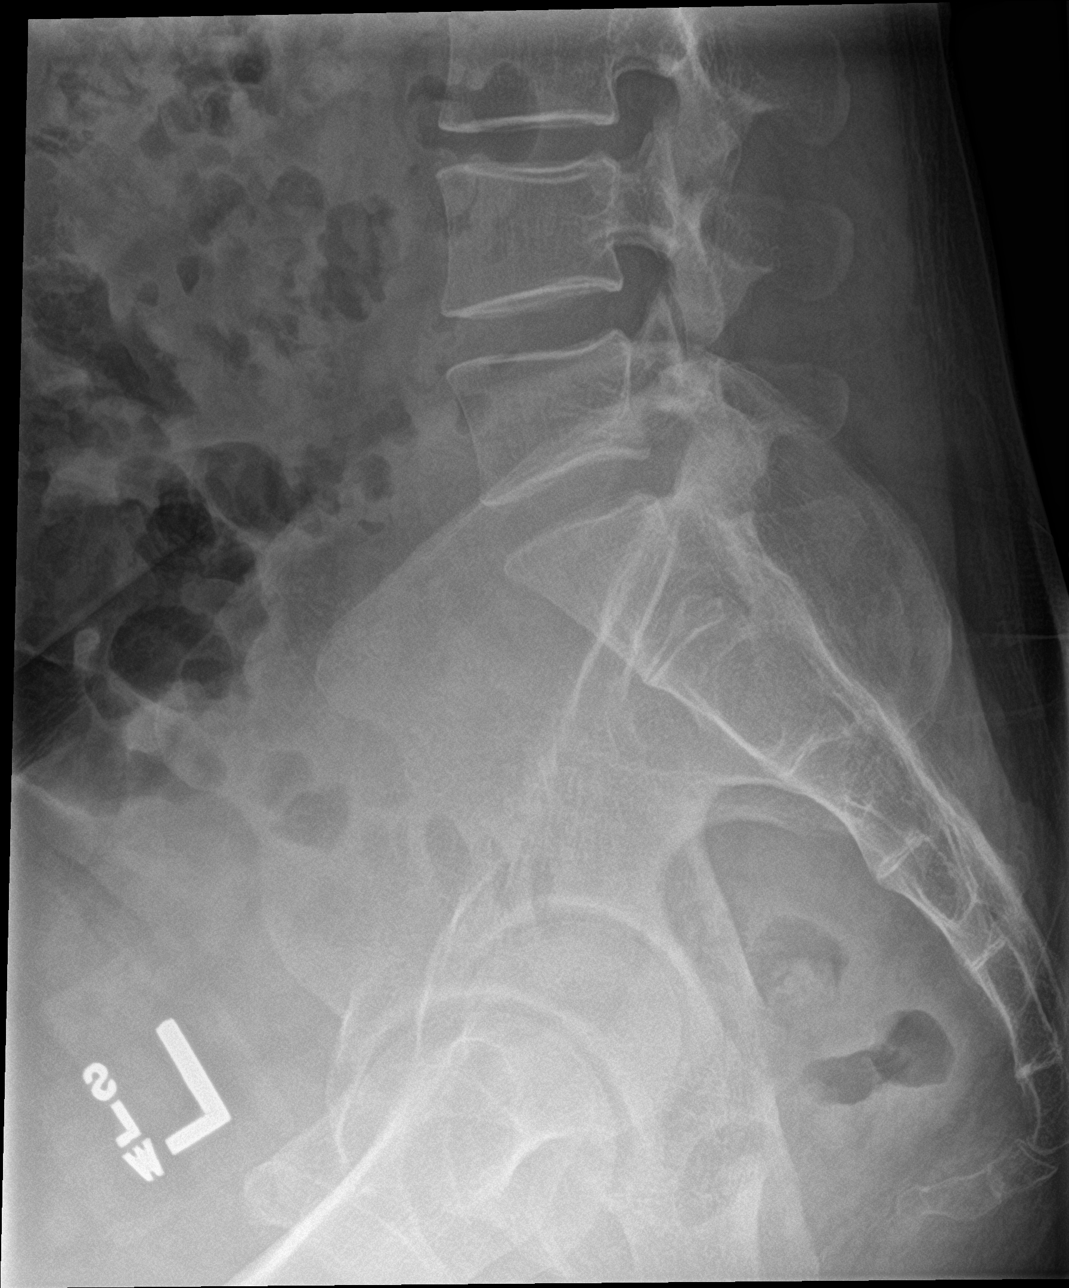

[5 of 5 positions shown; findings below may reference images not displayed]

FINDINGS: There is no evidence of lumbar spine fracture. Alignment is normal.
Intervertebral disc spaces are maintained.
IMPRESSION: Negative.

## 2017-09-29 IMAGING — DX DG ABDOMEN 1V
2 series · 2 of 2 positions shown · non-contrast
Comparison: None.

CLINICAL DATA: Epigastric abdominal pain and tenderness, vomiting,
constipation

EXAM:
ABDOMEN - 1 VIEW

[abdomen kub (1 of 2)]
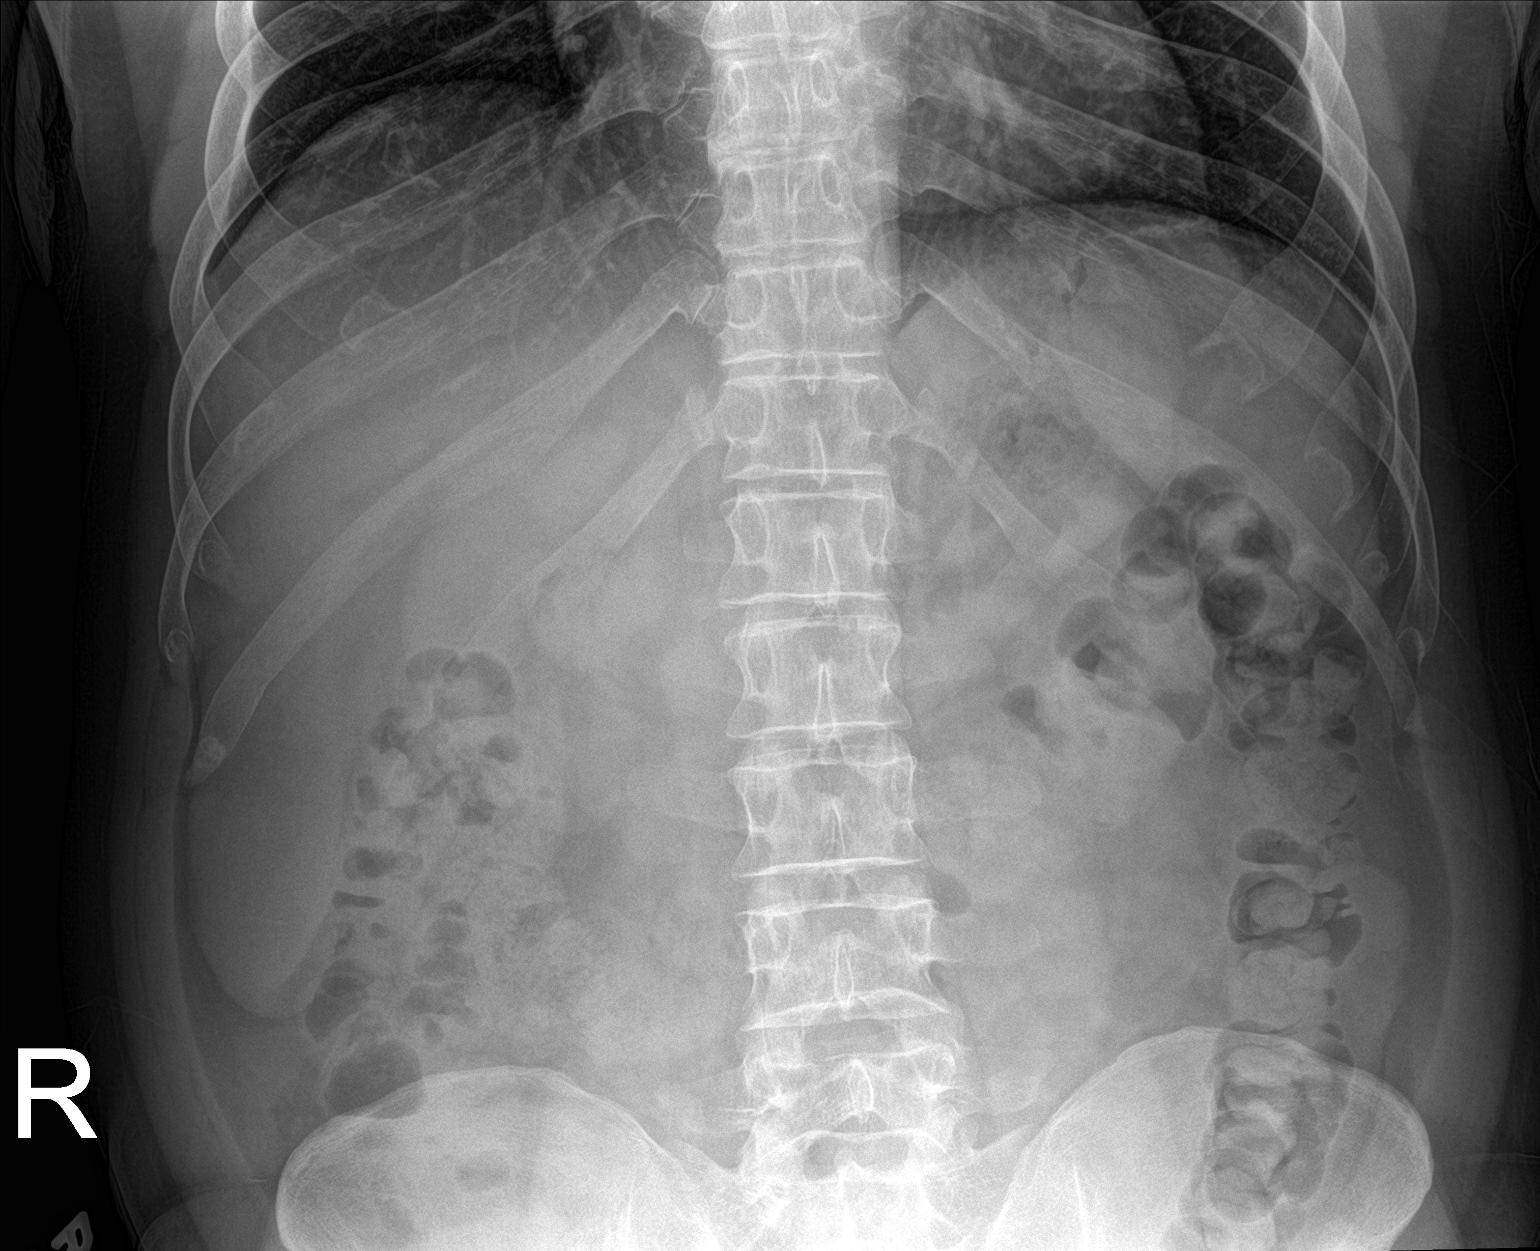

[abdomen kub (2 of 2)]
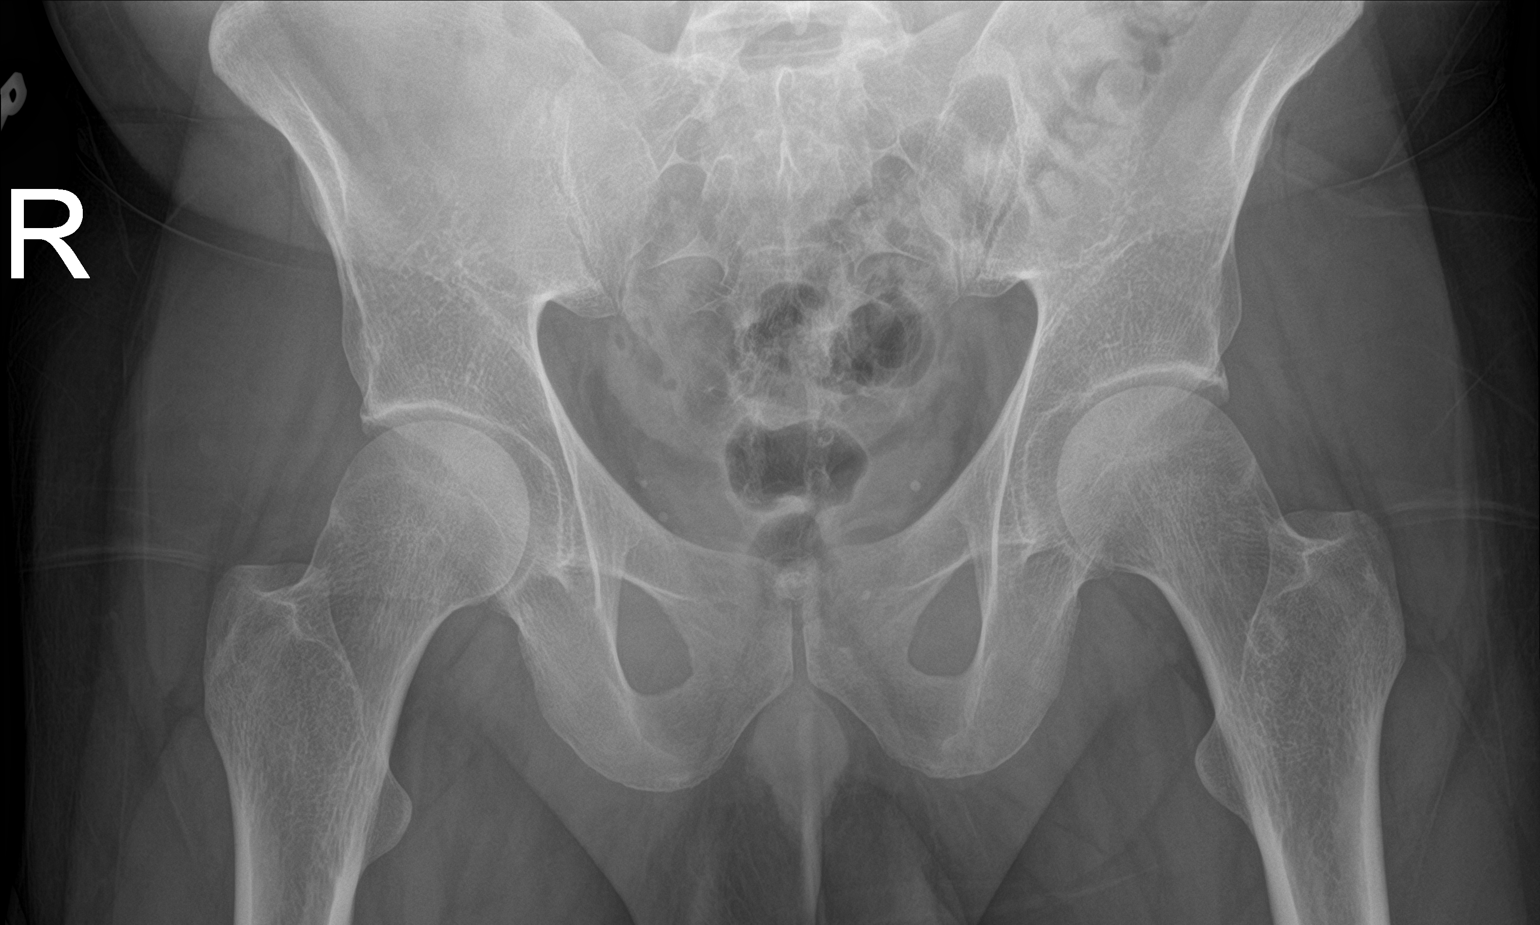

[2 of 2 positions shown; findings below may reference images not displayed]

FINDINGS: Normal bowel gas pattern without significant dilatation, obstruction
or ileus. Lung bases are clear. No abnormal calcifications. No acute
osseous finding.
IMPRESSION: Negative exam.

## 2018-06-29 ENCOUNTER — Encounter: Payer: Self-pay | Admitting: Emergency Medicine

## 2018-06-29 ENCOUNTER — Other Ambulatory Visit: Payer: Self-pay

## 2018-06-29 ENCOUNTER — Emergency Department (INDEPENDENT_AMBULATORY_CARE_PROVIDER_SITE_OTHER)
Admission: EM | Admit: 2018-06-29 | Discharge: 2018-06-29 | Disposition: A | Discharge status: Home / Self Care | Payer: Self-pay | Source: Home / Self Care | Attending: Family Medicine | Admitting: Family Medicine

## 2018-06-29 DIAGNOSIS — R112 Nausea with vomiting, unspecified: Secondary | ICD-10-CM

## 2018-06-29 DIAGNOSIS — G4700 Insomnia, unspecified: Secondary | ICD-10-CM

## 2018-06-29 DIAGNOSIS — R197 Diarrhea, unspecified: Secondary | ICD-10-CM

## 2018-06-29 MED ORDER — PROMETHAZINE HCL 25 MG PO TABS: 25.0000 mg | ORAL_TABLET | Freq: Four times a day (QID) | ORAL | 0 refills | Status: DC | PRN

## 2018-06-29 NOTE — ED Triage Notes (Signed)
Pt c/o of n/v/d that started yesterday. Vomiting approx. 5-6 times in last 24 hours.

## 2018-06-29 NOTE — ED Notes (Signed)
Pt refused CMP

## 2018-06-29 NOTE — ED Provider Notes (Signed)
[name]    CSN: [id number] Arrival date & time: 06/29/18  1147     History   Chief Complaint Chief Complaint  Patient presents with  . Nausea  . Emesis  . Diarrhea    HPI DANIELE YANKOWSKI is a 32 y.o. male.   HPI  JAIVON VANBEEK is a 32 y.o. male presenting to UC with c/o n/v/d that started yesterday with 1 episode of vomiting in the early afternoon, then several episodes of dry heaving at night. He has also vomited once today after drinking some water. Associated water to loose stools with 5-6 episodes over the last 24 hours. Denies blood or mucous in the stool. Mild generalized abdominal cramping.  Denies fever or chills. No cough, congestion or other URI symptoms. No known sick contacts or recent travel. Wife notes pt has had insomnia for about 6 months and is concerned about pt's heart but does not know if related. Pt denies personal or family hx of heart problems. Denies chest pain or SOB. Pt is a truck driver and works odd hours.    Past Medical History:  Diagnosis Date  . Complication of anesthesia   . Hematuria, microscopic    occ ,followed by urologist  . Inguinal hernia    right  . PONV (postoperative nausea and vomiting)     Patient Active Problem List   Diagnosis Date Noted  . Bilateral wrist pain 11/27/2016  . Back pain 11/27/2016  . Hematuria, microscopic 08/21/2016  . Lumbago 08/13/2016  . Vitamin D deficiency 05/19/2016  . Elevated uric acid in blood 05/19/2016  . Benign mole 05/16/2016  . Arthralgia 05/15/2016  . Inguinodynia 12/27/2011  . INGUINAL HERNIA, RIGHT 01/30/2011    Past Surgical History:  Procedure Laterality Date  . HERNIA REPAIR  03/27/11   right  . INGUINAL HERNIA REPAIR Bilateral 01/04/2015   Procedure: LAPAROSCOPIC BILATERAL INGUINAL HERNIA REPAIR WITH MESH;  Surgeon: Erroll Luna, MD;  Location: [facility];  Service: General;  Laterality: Bilateral;  . INSERTION OF MESH Bilateral 01/04/2015   Procedure: INSERTION OF  MESH;  Surgeon: Erroll Luna, MD;  Location: [place];  Service: General;  Laterality: Bilateral;  . LEG SURGERY     bilateral  . VASECTOMY Bilateral 04/04/2017   [facility]  . WISDOM TOOTH EXTRACTION         Home Medications    Prior to Admission medications   Medication Sig Start Date End Date Taking? Authorizing Provider  febuxostat (ULORIC) 40 MG tablet Take 1 tablet (40 mg total) by mouth daily. Pt could not tolerate allopurinol 11/27/16   Gregor Hams, MD  fluticasone [facility]) 50 MCG/ACT nasal spray Place 2 sprays into both nostrils daily. 04/19/17   Emeterio Reeve, DO  omeprazole (PRILOSEC) 40 MG capsule Take 1 capsule (40 mg total) by mouth daily. 08/13/16   Gregor Hams, MD  promethazine (PHENERGAN) 25 MG tablet Take 1 tablet (25 mg total) by mouth every 6 (six) hours as needed. 06/29/18   Noe Gens, PA-C    Family History Family History  Problem Relation Age of Onset  . Diabetes Father   . Hypertension Father     Social History Social History   Tobacco Use  . Smoking status: Never Smoker  . Smokeless tobacco: Never Used  Substance Use Topics  . Alcohol use: Yes    Comment: beer occ  . Drug use: No     Allergies   Patient has no known allergies.  Review of Systems Review of Systems  Constitutional: Positive for appetite change ( decreased  ). Negative for chills and fever.  HENT: Negative for congestion, ear pain, sore throat, trouble swallowing and voice change.   Respiratory: Negative for cough and shortness of breath.   Cardiovascular: Negative for chest pain and palpitations.  Gastrointestinal: Positive for abdominal pain, diarrhea, nausea and vomiting. Negative for blood in stool.  Musculoskeletal: Negative for arthralgias, back pain and myalgias.  Skin: Negative for rash.  Neurological: Negative for dizziness, light-headedness and headaches.     Physical Exam Triage Vital Signs ED Triage Vitals  Enc Vitals Group     BP  06/29/18 1218 (!) 149/95     Pulse Rate 06/29/18 1218 73     Resp --      Temp 06/29/18 1218 97.6 F (36.4 C)     Temp Source 06/29/18 1218 Oral     SpO2 06/29/18 1218 99 %     Weight 06/29/18 1219 167 lb 6.4 oz (75.9 kg)     Height 06/29/18 1219 5\' 7"  (1.702 m)     Head Circumference --      Peak Flow --      Pain Score 06/29/18 1219 8     Pain Loc --      Pain Edu? --      Excl. in [place]? --    No data found.  Updated Vital Signs BP (!) 149/95 (BP Location: Left Arm)   Pulse 73   Temp 97.6 F (36.4 C) (Oral)   Ht 5\' 7"  (1.702 m)   Wt 167 lb 6.4 oz (75.9 kg)   SpO2 99%   BMI 26.22 kg/m   Visual Acuity Right Eye Distance:   Left Eye Distance:   Bilateral Distance:    Right Eye Near:   Left Eye Near:    Bilateral Near:     Physical Exam  Constitutional: He is oriented to person, place, and time. He appears well-developed and well-nourished. No distress.  HENT:  Head: Normocephalic and atraumatic.  Right Ear: Tympanic membrane normal.  Left Ear: Tympanic membrane normal.  Nose: Nose normal. Right sinus exhibits no maxillary sinus tenderness and no frontal sinus tenderness. Left sinus exhibits no maxillary sinus tenderness and no frontal sinus tenderness.  Mouth/Throat: Uvula is midline, oropharynx is clear and moist and mucous membranes are normal.  Eyes: EOM are normal.  Neck: Normal range of motion. Neck supple.  Cardiovascular: Normal rate and regular rhythm.  Pulmonary/Chest: Effort normal and breath sounds normal. No stridor. No respiratory distress. He has no wheezes. He has no rales.  Abdominal: Soft. He exhibits no distension. There is tenderness. There is no CVA tenderness.    Musculoskeletal: Normal range of motion.  Lymphadenopathy:    He has no cervical adenopathy.  Neurological: He is alert and oriented to person, place, and time.  Skin: Skin is warm and dry. He is not diaphoretic.  Psychiatric: He has a normal mood and affect. His behavior is normal.   Nursing note and vitals reviewed.    UC Treatments / Results  Labs (all labs ordered are listed, but only abnormal results are displayed) Labs Reviewed  COMPLETE METABOLIC PANEL WITH GFR  POCT CBC W AUTO DIFF ([place])    EKG None  Radiology No results found.  Procedures Procedures (including critical care time)  Medications Ordered in UC Medications - No data to display  Initial Impression / Assessment and Plan / UC Course  I  have reviewed the triage vital signs and the nursing notes.  Pertinent labs & imaging results that were available during my care of the patient were reviewed by me and considered in my medical decision making (see chart for details).     CBC: WBC- 12 Mild RLQ abdominal tenderness.  Pt is afebrile. Some concern for early appendicitis. Pt is self-pay and would like to stay out of the hospital if possible. Discussed symptoms that warrant visit to ED for further evaluation. Wife and pt understanding.  Discussed low likelihood of symptoms being due to cardiac cause given pt's age and no FH of CAD. Offered to get baseline EKG as pt does not have a PCP right now due to lack of insurance. Pt would like to hold off, will f/u soon for his insomnia with PCP once he gets his insurance.  Final Clinical Impressions(s) / UC Diagnoses   Final diagnoses:  Nausea and vomiting in adult  Diarrhea of presumed infectious origin  Insomnia, unspecified type     Discharge Instructions      Your symptoms are likely due to a stomach virus but there is a small chance it could be due to early appendicitis.  If you develop worsening pain, Fever >100.4*F, vomiting despite medications, or other new concerning symptoms develop, please go to the hospital or call 911.   Please follow up with family medicine in 2-3 days if not improving.     ED Prescriptions    Medication Sig Dispense Auth. Provider   promethazine (PHENERGAN) 25 MG tablet Take 1 tablet (25  mg total) by mouth every 6 (six) hours as needed. 10 tablet Noe Gens, PA-C     Controlled Substance Prescriptions [place] Controlled Substance Registry consulted? Not Applicable   Tyrell Antonio 06/29/18 1350

## 2018-06-29 NOTE — Discharge Instructions (Signed)
°  Your symptoms are likely due to a stomach virus but there is a small chance it could be due to early appendicitis.  If you develop worsening pain, Fever >100.4*F, vomiting despite medications, or other new concerning symptoms develop, please go to the hospital or call 911.   Please follow up with family medicine in 2-3 days if not improving.

## 2018-07-03 ENCOUNTER — Telehealth: Payer: Self-pay | Admitting: Emergency Medicine

## 2018-07-03 LAB — POCT CBC W AUTO DIFF (K'VILLE URGENT CARE)

## 2018-07-03 NOTE — Telephone Encounter (Signed)
New encounter to add order.

## 2020-12-06 ENCOUNTER — Encounter: Payer: Self-pay | Admitting: Family Medicine

## 2020-12-06 ENCOUNTER — Other Ambulatory Visit: Payer: Self-pay

## 2020-12-06 ENCOUNTER — Ambulatory Visit (INDEPENDENT_AMBULATORY_CARE_PROVIDER_SITE_OTHER): Payer: Self-pay | Admitting: Family Medicine

## 2020-12-06 VITALS — BP 151/86 | HR 86 | Wt 184.4 lb

## 2020-12-06 DIAGNOSIS — R5383 Other fatigue: Secondary | ICD-10-CM

## 2020-12-06 DIAGNOSIS — R519 Headache, unspecified: Secondary | ICD-10-CM

## 2020-12-06 DIAGNOSIS — E790 Hyperuricemia without signs of inflammatory arthritis and tophaceous disease: Secondary | ICD-10-CM

## 2020-12-06 DIAGNOSIS — M25539 Pain in unspecified wrist: Secondary | ICD-10-CM

## 2020-12-06 MED ORDER — PREDNISONE 10 MG (48) PO TBPK: ORAL_TABLET | ORAL | 0 refills | Status: DC

## 2020-12-06 NOTE — Assessment & Plan Note (Signed)
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  We discussed having him discontinue analgesic headache medications.  Try to limit these to using only 1-2 times per week.  Recommend that he remain well-hydrated and get adequate rest each night.  I think steroid taper may be helpful in preventing the rebound headaches as well.  If these continue we can consider additional therapeutic options.

## 2020-12-06 NOTE — Progress Notes (Signed)
Roger Marquez - 35 y.o. male MRN [id number]  Date of birth: Feb 08, 1986  Subjective Chief Complaint  Patient presents with  . Establish Care    HPI Roger Marquez is a 35 year old male here today to reestablish care with our clinic.  He had previously seen Dr. Georgina Marquez here a few years ago.  He has concerns today regarding joint pain as well as headaches and poor sleep.  He has had problems with joint pain for several years.  He had previous work-up with Dr. Georgina Marquez a few years ago including autoimmune work-up.  Labs were unremarkable other than uric acid level being elevated.  He was started on Uloric but does not recall this being helpful.  He did try NSAIDs as well without relief.  He states that the only thing that provided relief for him was prednisone.  He has not noticed any significant swelling of his joints, redness or warmth.  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  He reports daily headaches.  He is using Excedrin or similar medication on a daily basis to help with this.  He denies any neurological changes including vision changes, tinnitus, weakness, numbness or tingling.  He does admit to poor sleep.  Denies heavy snoring.  ROS:  A comprehensive ROS was completed and negative except as noted per HPI    No Known Allergies  Past Medical History:  Diagnosis Date  . Complication of anesthesia   . Hematuria, microscopic    occ ,followed by urologist  . Inguinal hernia    right  . PONV (postoperative nausea and vomiting)     Past Surgical History:  Procedure Laterality Date  . HERNIA REPAIR  03/27/11   right  . INGUINAL HERNIA REPAIR Bilateral 01/04/2015   Procedure: LAPAROSCOPIC BILATERAL INGUINAL HERNIA REPAIR WITH MESH;  Surgeon: Erroll Luna, MD;  Location: [place];  Service: General;  Laterality: Bilateral;  . INSERTION OF MESH Bilateral 01/04/2015   Procedure: INSERTION OF MESH;  Surgeon: Erroll Luna, MD;   Location: [place];  Service: General;  Laterality: Bilateral;  . LEG SURGERY     bilateral  . VASECTOMY Bilateral 04/04/2017   [facility]  . WISDOM TOOTH EXTRACTION      Social History   Socioeconomic History  . Marital status: Married    Spouse name: Not on file  . Number of children: Not on file  . Years of education: Not on file  . Highest education level: Not on file  Occupational History  . Not on file  Tobacco Use  . Smoking status: Never Smoker  . Smokeless tobacco: Never Used  Vaping Use  . Vaping Use: Never used  Substance and Sexual Activity  . Alcohol use: Yes    Alcohol/week: 0.0 - 1.0 standard drinks    Comment: beer occ  . Drug use: No  . Sexual activity: Yes  Other Topics Concern  . Not on file  Social History Narrative  . Not on file   Social Determinants of Health   Financial Resource Strain: Not on file  Food Insecurity: Not on file  Transportation Needs: Not on file  Physical Activity: Not on file  Stress: Not on file  Social Connections: Not on file    Family History  Problem Relation Age of Onset  . Diabetes Father   . Hypertension Father     Health Maintenance  Topic Date Due  . Hepatitis C Screening  Never done  . HIV Screening  Never done  . TETANUS/TDAP  Never done  . COVID-19 Vaccine (1) 12/22/2020 (Originally 12/25/1997)  . INFLUENZA VACCINE  02/02/2021 (Originally 06/05/2020)     ----------------------------------------------------------------------------------------------------------------------------------------------------------------------------------------------------------------- Physical Exam BP (!) 151/86 (BP Location: Left Arm, Patient Position: Sitting, Cuff Size: Normal)   Pulse 86   Wt 184 lb 6.4 oz (83.6 kg)   SpO2 98%   BMI 28.88 kg/m   Physical Exam Constitutional:      Appearance: Normal appearance.  HENT:     Head: Normocephalic and atraumatic.  Eyes:     General: No scleral  icterus. Cardiovascular:     Rate and Rhythm: Normal rate and regular rhythm.  Pulmonary:     Effort: Pulmonary effort is normal.     Breath sounds: Normal breath sounds.  Musculoskeletal:        General: Tenderness (Tenderness to palpation along bilateral wrist.  No effusion noted.) present. No swelling.     Cervical back: Neck supple.  Neurological:     General: No focal deficit present.     Mental Status: He is alert and oriented to person, place, and time.     Cranial Nerves: No cranial nerve deficit.     Motor: No weakness.  Psychiatric:        Mood and Affect: Mood normal.        Behavior: Behavior normal.     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  No swelling or strength deficits noted.  We will recheck inflammatory markers and uric acid level.  We will go ahead and restart prednisone taper as I think this will also be helpful for his headaches to some degree.  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  We discussed having him discontinue analgesic headache medications.  Try to limit these to using only 1-2 times per week.  Recommend that he remain well-hydrated and get adequate rest each night.  I think steroid taper may be helpful in preventing the rebound headaches as well.  If these continue we can consider additional therapeutic options.   Meds ordered this encounter  Medications  . predniSONE (STERAPRED UNI-PAK 48 TAB) 10 MG (48) TBPK tablet    Sig: Taper as directed on packaging    Dispense:  48 tablet    Refill:  0    Return in about 3 weeks (around 12/27/2020) for Headaches/Joint pain.    This visit occurred during the SARS-CoV-2 public health emergency.  Safety  protocols were in place, including screening questions prior to the visit, additional usage of staff PPE, and extensive cleaning of exam room while observing appropriate contact time as indicated for disinfecting solutions.

## 2020-12-06 NOTE — Patient Instructions (Signed)
Nice to meet you today! Have labs completed.  Limit medications for headaches.  Stay well hydrated.  Start prednisone taper.  See me again in 2-3 weeks.    Analgesic Rebound Headache An analgesic rebound headache, sometimes called a medication overuse headache or a drug-induced headache, is a secondary disorder that is caused by the overuse of pain medicine (analgesic) to treat the original (primary) headache. Any type of primary headache can return as a rebound headache if a person regularly takes analgesics. The types of primary headaches that are commonly associated with rebound headaches include:  Migraines.  Headaches that are caused by tense muscles in the head and neck area (tension headaches).  Headaches that develop and happen again on one side of the head and around the eye (cluster headaches). If rebound headaches continue, they can become long-term, daily headaches. What are the causes? This condition may be caused by frequent use of:  Over-the-counter medicines such as aspirin, ibuprofen, and acetaminophen.  Sinus-relief medicines and medicines that contain caffeine.  Narcotic pain medicines such as codeine and oxycodone.  Some prescription migraine medicines. What are the signs or symptoms? The symptoms of a rebound headache are the same as the symptoms of the original headache. Some of the symptoms of specific types of headaches include: Migraine headache  Pulsing or throbbing pain on one or both sides of the head.  Severe pain that interferes with daily activities.  Pain that gets worse with physical activity.  Nausea, vomiting, or both.  Pain and sensitivity with exposure to bright light, loud noises, or strong smells.  Visual changes.  Numbness of one or both arms. Tension headache  Pressure around the head.  Dull, aching head pain.  Pain felt over the front and sides of the head.  Tenderness in the muscles of the head, neck, and  shoulders. Cluster headache  Severe pain that begins in or around one eye or temple.  Droopy or swollen eyelid, or redness and tearing in the eye on the same side as the pain.  One-sided head pain.  Nausea.  Runny nose.  Sweaty, pale facial skin.  Restlessness. How is this diagnosed? This condition is diagnosed by:  Reviewing your medical history. This includes the nature of your primary headaches.  Reviewing the types of pain medicines that you have been using to treat your primary headaches and how often you take them. How is this treated? This condition may be treated or managed by:  Discontinuing frequent use of the analgesic medicine. Doing this may worsen your headaches at first, but the pain should eventually become more manageable, less frequent, and less severe.  Seeing a headache specialist. He or she may be able to help you manage your headaches and help make sure there is not another cause of the headaches.  Using methods of stress relief, such as acupuncture, counseling, biofeedback, and massage. Talk with your health care provider about which methods might be good for you. Follow these instructions at home: Medicines  Take over-the-counter and prescription medicines only as told by your health care provider.  Stop the repeated use of pain medicine as told by your health care provider. Stopping can be difficult. Carefully follow instructions from your health care provider.   Lifestyle  Follow a regular sleep schedule. Do not vary the time that you go to bed or the amount that you sleep from day to day. It is important to stay on the same schedule to help prevent headaches. Get 7-9 hours of  sleep each night, or the amount recommended by your health care provider.  Exercise regularly. Exercise for at least 30 minutes, 5 times each week.  Limit or manage stress. Consider stress-relief options such as acupuncture, counseling, biofeedback, and massage. Talk with your  health care provider about which methods might be good for you.  Do not drink alcohol.  Do not use any products that contain nicotine or tobacco, such as cigarettes, e-cigarettes, and chewing tobacco. If you need help quitting, ask your health care provider.   General instructions  Avoid triggers that are known to cause your primary headaches.  Keep all follow-up visits as told by your health care provider. This is important. Contact a health care provider if:  You continue to experience headaches after following treatments that your health care provider recommended. Get help right away if you have:  New headache pain.  Headache pain that is different than what you have experienced in the past.  Numbness or tingling in your arms or legs.  Changes in your speech or vision. Summary  An analgesic rebound headache, sometimes called a medication overuse headache or a drug-induced headache, is caused by the overuse of pain medicine (analgesic) to treat the original (primary) headache.  Any type of primary headache can return as a rebound headache if a person regularly takes analgesics. The types of primary headaches that are commonly associated with rebound headaches include migraines, tension headaches, and cluster headaches.  Analgesic rebound headaches can occur with frequent use of over-the-counter medicines and prescription medicines.  Treatment involves stopping the medicine that is being overused. This will improve headache frequency and severity. This information is not intended to replace advice given to you by your health care provider. Make sure you discuss any questions you have with your health care provider. Document Revised: 11/19/2019 Document Reviewed: 11/19/2019 Elsevier Patient Education  2021 [employer].

## 2020-12-06 NOTE — Assessment & Plan Note (Signed)
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  No swelling or strength deficits noted.  We will recheck inflammatory markers and uric acid level.  We will go ahead and restart prednisone taper as I think this will also be helpful for his headaches to some degree.

## 2020-12-07 LAB — TSH: TSH: 2.49 mIU/L (ref 0.40–4.50)

## 2020-12-07 LAB — COMPLETE METABOLIC PANEL WITH GFR
AG Ratio: 1.7 (calc) (ref 1.0–2.5)
ALT: 173 U/L — ABNORMAL HIGH (ref 9–46)
CO2: 30 mmol/L (ref 20–32)
Creat: 0.9 mg/dL (ref 0.60–1.35)
GFR, Est Non African American: 111 mL/min/{1.73_m2} (ref >=60)
Total Bilirubin: 0.6 mg/dL (ref 0.2–1.2)

## 2020-12-08 LAB — ANA,IFA RA DIAG PNL W/RFLX TIT/PATN
Anti Nuclear Antibody (ANA): NEGATIVE
Cyclic Citrullin Peptide Ab: <16 UNITS
Rheumatoid fact SerPl-aCnc: <14 IU/mL (ref <14)

## 2020-12-08 LAB — COMPLETE METABOLIC PANEL WITH GFR
AST: 78 U/L — ABNORMAL HIGH (ref 10–40)
Albumin: 4.8 g/dL (ref 3.6–5.1)
Alkaline phosphatase (APISO): 39 U/L (ref 36–130)
BUN: 11 mg/dL (ref 7–25)
Calcium: 10 mg/dL (ref 8.6–10.3)
Chloride: 102 mmol/L (ref 98–110)
GFR, Est African American: 129 mL/min/{1.73_m2} (ref >=60)
Globulin: 2.9 g/dL (calc) (ref 1.9–3.7)
Glucose, Bld: 98 mg/dL (ref 65–99)
Potassium: 4.3 mmol/L (ref 3.5–5.3)
Sodium: 141 mmol/L (ref 135–146)
Total Protein: 7.7 g/dL (ref 6.1–8.1)

## 2020-12-08 LAB — CBC
HCT: 46.7 % (ref 38.5–50.0)
Hemoglobin: 16.5 g/dL (ref 13.2–17.1)
MCH: 30.1 pg (ref 27.0–33.0)
MCHC: 35.3 g/dL (ref 32.0–36.0)
MCV: 85.2 fL (ref 80.0–100.0)
MPV: 10.1 fL (ref 7.5–12.5)
Platelets: 320 10*3/uL (ref 140–400)
RBC: 5.48 10*6/uL (ref 4.20–5.80)
RDW: 13.3 % (ref 11.0–15.0)
WBC: 8.6 10*3/uL (ref 3.8–10.8)

## 2020-12-08 LAB — C-REACTIVE PROTEIN: CRP: 3.7 mg/L (ref <8.0)

## 2020-12-08 LAB — SEDIMENTATION RATE: Sed Rate: 2 mm/h (ref 0–15)

## 2020-12-08 LAB — URIC ACID: Uric Acid, Serum: 7.9 mg/dL (ref 4.0–8.0)

## 2020-12-27 ENCOUNTER — Ambulatory Visit: Payer: Self-pay | Admitting: Family Medicine

## 2020-12-28 ENCOUNTER — Ambulatory Visit: Payer: Self-pay | Admitting: Family Medicine

## 2021-01-04 ENCOUNTER — Encounter: Payer: Self-pay | Admitting: Family Medicine

## 2021-01-04 ENCOUNTER — Other Ambulatory Visit: Payer: Self-pay

## 2021-01-04 ENCOUNTER — Ambulatory Visit (INDEPENDENT_AMBULATORY_CARE_PROVIDER_SITE_OTHER): Payer: Self-pay | Admitting: Family Medicine

## 2021-01-04 VITALS — BP 142/79 | HR 104 | Temp 98.5°F | Wt 188.2 lb

## 2021-01-04 DIAGNOSIS — G8929 Other chronic pain: Secondary | ICD-10-CM

## 2021-01-04 DIAGNOSIS — R748 Abnormal levels of other serum enzymes: Secondary | ICD-10-CM

## 2021-01-04 DIAGNOSIS — M25539 Pain in unspecified wrist: Secondary | ICD-10-CM

## 2021-01-04 DIAGNOSIS — R7989 Other specified abnormal findings of blood chemistry: Secondary | ICD-10-CM

## 2021-01-04 DIAGNOSIS — M2550 Pain in unspecified joint: Secondary | ICD-10-CM

## 2021-01-04 MED ORDER — DULOXETINE HCL 30 MG PO CPEP: 30.0000 mg | ORAL_CAPSULE | Freq: Every day | ORAL | 3 refills | Status: DC

## 2021-01-04 NOTE — Assessment & Plan Note (Signed)
Possibly due to fatty liver.  Check Hep C antibody We may also consider RUQ [place] as well.

## 2021-01-04 NOTE — Progress Notes (Signed)
MAURI Marquez - 35 y.o. male MRN [id number]  Date of birth: Mar 18, 1986  Subjective Chief Complaint  Patient presents with  . lab results    HPI Roger Marquez is a 35 y.o. male here today for follow up of chronic joint and muscle pain.  Inflammatory markers and uric acid normal on recent labs.  He did have elevated liver enzymes which have also been elevated in the past.  He continues to have pain throughout several joints.  No significant swelling.  Has tried anti-inflammatories previously without much improvement.  Improved with steroids in the past but most recent course was not very helpful.    ROS:  A comprehensive ROS was completed and negative except as noted per HPI  No Known Allergies  Past Medical History:  Diagnosis Date  . Complication of anesthesia   . Hematuria, microscopic    occ ,followed by urologist  . Inguinal hernia    right  . PONV (postoperative nausea and vomiting)     Past Surgical History:  Procedure Laterality Date  . HERNIA REPAIR  03/27/11   right  . INGUINAL HERNIA REPAIR Bilateral 01/04/2015   Procedure: LAPAROSCOPIC BILATERAL INGUINAL HERNIA REPAIR WITH MESH;  Surgeon: Erroll Luna, MD;  Location: [facility];  Service: General;  Laterality: Bilateral;  . INSERTION OF MESH Bilateral 01/04/2015   Procedure: INSERTION OF MESH;  Surgeon: Erroll Luna, MD;  Location: [place];  Service: General;  Laterality: Bilateral;  . LEG SURGERY     bilateral  . VASECTOMY Bilateral 04/04/2017   [facility]  . WISDOM TOOTH EXTRACTION      Social History   Socioeconomic History  . Marital status: Married    Spouse name: Not on file  . Number of children: Not on file  . Years of education: Not on file  . Highest education level: Not on file  Occupational History  . Not on file  Tobacco Use  . Smoking status: Never Smoker  . Smokeless tobacco: Never Used  Vaping Use  . Vaping Use: Never used  Substance and Sexual Activity  . Alcohol use: Yes     Alcohol/week: 0.0 - 1.0 standard drinks    Comment: beer occ  . Drug use: No  . Sexual activity: Yes  Other Topics Concern  . Not on file  Social History Narrative  . Not on file   Social Determinants of Health   Financial Resource Strain: Not on file  Food Insecurity: Not on file  Transportation Needs: Not on file  Physical Activity: Not on file  Stress: Not on file  Social Connections: Not on file    Family History  Problem Relation Age of Onset  . Diabetes Father   . Hypertension Father     Health Maintenance  Topic Date Due  . Hepatitis C Screening  Never done  . HIV Screening  Never done  . COVID-19 Vaccine (1) 01/20/2021 (Originally 12/25/1997)  . INFLUENZA VACCINE  02/02/2021 (Originally 06/05/2020)  . TETANUS/TDAP  01/04/2022 (Originally 12/25/2004)  . HPV VACCINES  Aged Out     ----------------------------------------------------------------------------------------------------------------------------------------------------------------------------------------------------------------- Physical Exam BP (!) 142/79 (BP Location: Left Arm, Patient Position: Sitting, Cuff Size: Normal)   Pulse (!) 104   Temp 98.5 F (36.9 C)   Wt 188 lb 3.2 oz (85.4 kg)   SpO2 95%   BMI 29.48 kg/m   Physical Exam Constitutional:      Appearance: Normal appearance.  HENT:     Head: Normocephalic and atraumatic.  Eyes:  General: No scleral icterus. Cardiovascular:     Rate and Rhythm: Normal rate and regular rhythm.  Pulmonary:     Effort: Pulmonary effort is normal.     Breath sounds: Normal breath sounds.  Musculoskeletal:     Cervical back: Neck supple.  Neurological:     General: No focal deficit present.     Mental Status: He is alert.  Psychiatric:        Mood and Affect: Mood normal.        Behavior: Behavior normal.      ------------------------------------------------------------------------------------------------------------------------------------------------------------------------------------------------------------------- Assessment and Plan  Elevated LFTs Possibly due to fatty liver.  Check Hep C antibody We may also consider RUQ [place] as well.   Arthralgia Autoimmune and inflammatory markers negative.  Possible FM, will add cymbalta 30mg  daily.  Check lyme titers and CK levels.    Meds ordered this encounter  Medications  . DULoxetine (CYMBALTA) 30 MG capsule    Sig: Take 1 capsule (30 mg total) by mouth daily.    Dispense:  30 capsule    Refill:  3    Return in about 6 weeks (around 02/15/2021) for Joint pain.    This visit occurred during the SARS-CoV-2 public health emergency.  Safety protocols were in place, including screening questions prior to the visit, additional usage of staff PPE, and extensive cleaning of exam room while observing appropriate contact time as indicated for disinfecting solutions.

## 2021-01-04 NOTE — Assessment & Plan Note (Signed)
Autoimmune and inflammatory markers negative.  Possible FM, will add cymbalta 30mg  daily.  Check lyme titers and CK levels.

## 2021-01-04 NOTE — Patient Instructions (Signed)
Have additional labs completed Try duloxetine 30mg  daily.  See me again in about 6 weeks.

## 2021-01-09 LAB — HEPATITIS C ANTIBODY
Hepatitis C Ab: NONREACTIVE
SIGNAL TO CUT-OFF: 0.01 (ref <1.00)

## 2021-01-09 LAB — CK: Total CK: 126 U/L (ref 44–196)

## 2021-01-09 LAB — B. BURGDORFI ANTIBODIES: B burgdorferi Ab IgG+IgM: <0.9 index

## 2021-02-21 ENCOUNTER — Other Ambulatory Visit: Payer: Self-pay

## 2021-02-21 ENCOUNTER — Encounter: Payer: Self-pay | Admitting: Family Medicine

## 2021-02-21 ENCOUNTER — Ambulatory Visit (INDEPENDENT_AMBULATORY_CARE_PROVIDER_SITE_OTHER): Payer: Self-pay | Admitting: Family Medicine

## 2021-02-21 VITALS — BP 131/81 | HR 88 | Ht 67.0 in | Wt 187.5 lb

## 2021-02-21 DIAGNOSIS — G8929 Other chronic pain: Secondary | ICD-10-CM

## 2021-02-21 DIAGNOSIS — M2550 Pain in unspecified joint: Secondary | ICD-10-CM

## 2021-02-21 NOTE — Progress Notes (Signed)
Roger Marquez - 35 y.o. male MRN [id number]  Date of birth: Feb 14, 1986  Subjective Chief Complaint  Patient presents with  . Joint Pain    HPI Roger Marquez is a 35 y.o. male here today for follow up on chronic joint pain.  Pain is throughout several joints.  He does feel like joints are swollen at times. Xrays has been unremarkable.  He has had inflammatory markers checked as well as autoimmune panel and uric acid levels.  LFT's elevated, hep c antibody and lyme antibodies negative.   All labs have been unremarkable..  We tried him on cymbalta at last appointment but he has not noted any improvement since starting this.  He has gotten relief with steroids in the past.    ROS:  A comprehensive ROS was completed and negative except as noted per HPI   No Known Allergies  Past Medical History:  Diagnosis Date  . Complication of anesthesia   . Hematuria, microscopic    occ ,followed by urologist  . Inguinal hernia    right  . PONV (postoperative nausea and vomiting)     Past Surgical History:  Procedure Laterality Date  . HERNIA REPAIR  03/27/11   right  . INGUINAL HERNIA REPAIR Bilateral 01/04/2015   Procedure: LAPAROSCOPIC BILATERAL INGUINAL HERNIA REPAIR WITH MESH;  Surgeon: Erroll Luna, MD;  Location: [place] [facility];  Service: General;  Laterality: Bilateral;  . INSERTION OF MESH Bilateral 01/04/2015   Procedure: INSERTION OF MESH;  Surgeon: Erroll Luna, MD;  Location: [place];  Service: General;  Laterality: Bilateral;  . LEG SURGERY     bilateral  . VASECTOMY Bilateral 04/04/2017   [facility]  . WISDOM TOOTH EXTRACTION      Social History   Socioeconomic History  . Marital status: Married    Spouse name: Not on file  . Number of children: Not on file  . Years of education: Not on file  . Highest education level: Not on file  Occupational History  . Not on file  Tobacco Use  . Smoking status: Never Smoker  . Smokeless tobacco: Never Used  Vaping Use  . Vaping  Use: Never used  Substance and Sexual Activity  . Alcohol use: Yes    Alcohol/week: 0.0 - 1.0 standard drinks    Comment: beer occ  . Drug use: No  . Sexual activity: Yes  Other Topics Concern  . Not on file  Social History Narrative  . Not on file   Social Determinants of Health   Financial Resource Strain: Not on file  Food Insecurity: Not on file  Transportation Needs: Not on file  Physical Activity: Not on file  Stress: Not on file  Social Connections: Not on file    Family History  Problem Relation Age of Onset  . Diabetes Father   . Hypertension Father     Health Maintenance  Topic Date Due  . COVID-19 Vaccine (1) Never done  . HIV Screening  Never done  . TETANUS/TDAP  01/04/2022 (Originally 12/25/2004)  . INFLUENZA VACCINE  06/05/2021  . Hepatitis C Screening  Completed  . HPV VACCINES  Aged Out     ----------------------------------------------------------------------------------------------------------------------------------------------------------------------------------------------------------------- Physical Exam BP 131/81 (BP Location: Left Arm, Patient Position: Sitting, Cuff Size: Normal)   Pulse 88   Ht 5\' 7"  (1.702 m)   Wt 187 lb 8 oz (85 kg)   SpO2 98%   BMI 29.37 kg/m   Physical Exam Constitutional:      Appearance:  Normal appearance.  HENT:     Head: Normocephalic and atraumatic.  Eyes:     General: No scleral icterus. Musculoskeletal:        General: Tenderness (hands and wrists) present. No swelling or deformity.  Skin:    General: Skin is warm and dry.  Neurological:     General: No focal deficit present.     Mental Status: He is alert and oriented to person, place, and time.  Psychiatric:        Mood and Affect: Mood normal.        Behavior: Behavior normal.      ------------------------------------------------------------------------------------------------------------------------------------------------------------------------------------------------------------------- Assessment and Plan  Chronic joint pain Labs and xrays have been relatively unremarkable.  He didn't really have any relief with cymbalta.  Will refer to rheumatology for evaluation for possible seronegative RA or other inflammatory arthropathies.    No orders of the defined types were placed in this encounter.   No follow-ups on file.    This visit occurred during the SARS-CoV-2 public health emergency.  Safety protocols were in place, including screening questions prior to the visit, additional usage of staff PPE, and extensive cleaning of exam room while observing appropriate contact time as indicated for disinfecting solutions.

## 2021-02-21 NOTE — Assessment & Plan Note (Signed)
Labs and xrays have been relatively unremarkable.  He didn't really have any relief with cymbalta.  Will refer to rheumatology for evaluation for possible seronegative RA or other inflammatory arthropathies.

## 2021-02-21 NOTE — Patient Instructions (Signed)
I have placed a referral to rheumatology.  You should be contacted to set up appointment for this.

## 2022-03-08 ENCOUNTER — Ambulatory Visit: Payer: Self-pay | Admitting: Family Medicine

## 2023-11-05 ENCOUNTER — Ambulatory Visit
Admission: EM | Admit: 2023-11-05 | Discharge: 2023-11-05 | Disposition: A | Discharge status: Home / Self Care | Payer: Self-pay | Attending: Family Medicine | Admitting: Family Medicine

## 2023-11-05 DIAGNOSIS — J209 Acute bronchitis, unspecified: Secondary | ICD-10-CM

## 2023-11-05 MED ORDER — PREDNISONE 20 MG PO TABS: 40.0000 mg | ORAL_TABLET | Freq: Every day | ORAL | 0 refills | Status: DC

## 2023-11-05 MED ORDER — DOXYCYCLINE HYCLATE 100 MG PO CAPS: 100.0000 mg | ORAL_CAPSULE | Freq: Two times a day (BID) | ORAL | 0 refills | Status: DC

## 2023-11-05 MED ORDER — PROMETHAZINE-DM 6.25-15 MG/5ML PO SYRP: 5.0000 mL | ORAL_SOLUTION | Freq: Four times a day (QID) | ORAL | 0 refills | Status: DC | PRN

## 2023-11-05 NOTE — Discharge Instructions (Signed)
 Take doxycycline  2 times a day.  Take this medication with food Take prednisone  once a day for 5 days Continue to drink lots of water.  Run a humidifier if you have 1 available I have given you a prescription cough medicine to take at bedtime and when you are not working See your doctor if not improving by next week

## 2023-11-05 NOTE — ED Triage Notes (Addendum)
 [facility] presents to [facility] with [facility] og cough for 3.5 weeks. Pt reports worsening over the last week with sob and wheezing. Pt reports  he has tried otc musinex, nyquill, dayquill, benadryl  and robutussin for symptoms. Pt son has same symptoms.

## 2023-11-05 NOTE — ED Provider Notes (Signed)
 [facility]    CSN: [id number] Arrival date & time: 11/05/23  1307      History   Chief Complaint Chief Complaint  Patient presents with   Cough    HPI Roger Marquez is a 37 y.o. male.   Cough for 3-1/2 weeks.  Some shortness of breath.  Wheezing with cough.  Bad coughing spells that cause him to vomit.  He is coughing up sputum.  He has mucus in the back of his throat.  States his son has been sick at home with a cough as well.    Past Medical History:  Diagnosis Date   Complication of anesthesia    Hematuria, microscopic    occ ,followed by urologist   Inguinal hernia    right   PONV (postoperative nausea and vomiting)     Patient Active Problem List   Diagnosis Date Noted   Chronic joint pain 02/21/2021   Elevated LFTs 01/04/2021   Daily headache 12/06/2020   Bilateral wrist pain 11/27/2016   Back pain 11/27/2016   Hematuria, microscopic 08/21/2016   Lumbago 08/13/2016   Vitamin D  deficiency 05/19/2016   Elevated uric acid in blood 05/19/2016   Benign mole 05/16/2016   Arthralgia 05/15/2016   Inguinodynia 12/27/2011   Inguinal hernia 01/30/2011    Past Surgical History:  Procedure Laterality Date   HERNIA REPAIR  03/27/11   right   INGUINAL HERNIA REPAIR Bilateral 01/04/2015   Procedure: LAPAROSCOPIC BILATERAL INGUINAL HERNIA REPAIR WITH MESH;  Surgeon: Debby Shipper, MD;  Location: [facility];  Service: General;  Laterality: Bilateral;   INSERTION OF MESH Bilateral 01/04/2015   Procedure: INSERTION OF MESH;  Surgeon: Debby Shipper, MD;  Location[facility];  Service: General;  Laterality: Bilateral;   LEG SURGERY     bilateral   VASECTOMY Bilateral 04/04/2017   [facility]   WISDOM TOOTH EXTRACTION         Home Medications    Prior to Admission medications   Medication Sig Start Date End Date Taking? Authorizing Provider  doxycycline  (VIBRAMYCIN ) 100 MG capsule Take 1 capsule (100 mg total) by mouth 2 (two) times daily. 11/05/23  Yes  Maranda Jamee Jacob, MD  predniSONE  (DELTASONE ) 20 MG tablet Take 2 tablets (40 mg total) by mouth daily with breakfast. 11/05/23  Yes Maranda Jamee Jacob, MD  promethazine -dextromethorphan (PROMETHAZINE -DM) 6.25-15 MG/5ML syrup Take 5 mLs by mouth 4 (four) times daily as needed for cough. 11/05/23  Yes Maranda Jamee Jacob, MD  cetirizine (ZYRTEC) 10 MG tablet Take 10 mg by mouth daily.    [provider]  Omeprazole  Magnesium (PRILOSEC OTC PO) Take by mouth.    [provider]  VITAMIN D  PO Take by mouth.    [provider]    Family History Family History  Problem Relation Age of Onset   Diabetes Father    Hypertension Father     Social History Social History   Tobacco Use   Smoking status: Never   Smokeless tobacco: Never  Vaping Use   Vaping status: Never Used  Substance Use Topics   Alcohol use: Yes    Alcohol/week: 0.0 - 1.0 standard drinks of alcohol    Comment: beer occ   Drug use: No     Allergies   Patient has no known allergies.   Review of Systems Review of Systems See HPI  Physical Exam Triage Vital Signs ED Triage Vitals  Encounter Vitals Group     BP 11/05/23 1346 (!)  135/93     Systolic BP Percentile --      Diastolic BP Percentile --      Pulse Rate 11/05/23 1346 87     Resp 11/05/23 1346 16     Temp 11/05/23 1346 98.1 F (36.7 C)     Temp src --      SpO2 11/05/23 1346 98 %     Weight --      Height --      Head Circumference --      Peak Flow --      Pain Score 11/05/23 1343 6     Pain Loc --      Pain Education --      Exclude from Growth Chart --    No data found.  Updated Vital Signs BP (!) 135/93   Pulse 87   Temp 98.1 F (36.7 C)   Resp 16   SpO2 98%      Physical Exam Constitutional:      General: He is not in acute distress.    Appearance: He is well-developed.  HENT:     Head: Normocephalic and atraumatic.     Right Ear: Tympanic membrane and ear canal normal.     Left Ear: Tympanic  membrane and ear canal normal.     Nose: Nose normal. No congestion.     Mouth/Throat:     Mouth: Mucous membranes are moist.     Pharynx: No posterior oropharyngeal erythema.  Eyes:     Conjunctiva/sclera: Conjunctivae normal.     Pupils: Pupils are equal, round, and reactive to light.  Cardiovascular:     Rate and Rhythm: Normal rate.     Heart sounds: Normal heart sounds.  Pulmonary:     Effort: Pulmonary effort is normal. No respiratory distress.     Breath sounds: Wheezing present.  Abdominal:     General: There is no distension.     Palpations: Abdomen is soft.  Musculoskeletal:        General: Normal range of motion.     Cervical back: Normal range of motion.  Skin:    General: Skin is warm and dry.  Neurological:     Mental Status: He is alert.      UC Treatments / Results  Labs (all labs ordered are listed, but only abnormal results are displayed) Labs Reviewed - No data to display  EKG   Radiology No results found.  Procedures Procedures (including critical care time)  Medications Ordered in UC Medications - No data to display  Initial Impression / Assessment and Plan / UC Course  I have reviewed the triage vital signs and the nursing notes.  Pertinent labs & imaging results that were available during my care of the patient were reviewed by me and considered in my medical decision making (see chart for details).     Final Clinical Impressions(s) / UC Diagnoses   Final diagnoses:  Acute bronchitis, unspecified organism     Discharge Instructions      Take doxycycline  2 times a day.  Take this medication with food Take prednisone  once a day for 5 days Continue to drink lots of water.  Run a humidifier if you have 1 available I have given you a prescription cough medicine to take at bedtime and when you are not working See your doctor if not improving by next week   ED Prescriptions     Medication Sig Dispense Auth. Provider   doxycycline   (VIBRAMYCIN )  100 MG capsule Take 1 capsule (100 mg total) by mouth 2 (two) times daily. 20 capsule Maranda Jamee Jacob, MD   promethazine -dextromethorphan (PROMETHAZINE -DM) 6.25-15 MG/5ML syrup Take 5 mLs by mouth 4 (four) times daily as needed for cough. 118 mL Maranda Jamee Jacob, MD   predniSONE  (DELTASONE ) 20 MG tablet Take 2 tablets (40 mg total) by mouth daily with breakfast. 10 tablet Maranda Jamee Jacob, MD      PDMP not reviewed this encounter.   Maranda Jamee Jacob, MD 11/05/23 [phone]

## 2023-11-13 ENCOUNTER — Ambulatory Visit (INDEPENDENT_AMBULATORY_CARE_PROVIDER_SITE_OTHER): Payer: Self-pay | Admitting: Family Medicine

## 2023-11-13 VITALS — BP 132/88 | HR 87 | Ht 67.0 in | Wt 182.0 lb

## 2023-11-13 DIAGNOSIS — R058 Other specified cough: Secondary | ICD-10-CM | POA: Insufficient documentation

## 2023-11-13 MED ORDER — FLUTICASONE PROPIONATE 50 MCG/ACT NA SUSP: 2.0000 | Freq: Every day | NASAL | 6 refills | Status: DC

## 2023-11-13 MED ORDER — BENZONATATE 200 MG PO CAPS: 200.0000 mg | ORAL_CAPSULE | Freq: Two times a day (BID) | ORAL | 1 refills | Status: DC | PRN

## 2023-11-13 MED ORDER — PREDNISONE 50 MG PO TABS: ORAL_TABLET | ORAL | 0 refills | Status: DC

## 2023-11-13 NOTE — Assessment & Plan Note (Signed)
 Recommend continued supportive care.  Adding flonase, tessalon and steroid burst.  Stay well hydrated.  Humidifier at home may be helpful as well.

## 2023-11-13 NOTE — Progress Notes (Signed)
 Roger Marquez - 38 y.o. male MRN [id number]  Date of birth: [birth year]  Subjective Chief Complaint  Patient presents with   Cough    HPI Roger Marquez is a 38 y.o. male here today with complaint of cough.  He had illness about 4.5 weeks ago, treated with antibiotics and steroids.  Symptoms didn't really get a whole lot better after this.  He has had continued drainage and cough is worse first thing in the morning.  He feels like he is having wheezing when he is having the coughing fits.  He denies dyspnea throughout the day.   ROS:  A comprehensive ROS was completed and negative except as noted per HPI  No Known Allergies  Past Medical History:  Diagnosis Date   Complication of anesthesia    Hematuria, microscopic    occ ,followed by urologist   Inguinal hernia    right   PONV (postoperative nausea and vomiting)     Past Surgical History:  Procedure Laterality Date   HERNIA REPAIR  03/27/11   right   INGUINAL HERNIA REPAIR Bilateral 01/04/2015   Procedure: LAPAROSCOPIC BILATERAL INGUINAL HERNIA REPAIR WITH MESH;  Surgeon: Debby Shipper, MD;  Location: [facility];  Service: General;  Laterality: Bilateral;   INSERTION OF MESH Bilateral 01/04/2015   Procedure: INSERTION OF MESH;  Surgeon: Debby Shipper, MD;  Location: [facility];  Service: General;  Laterality: Bilateral;   LEG SURGERY     bilateral   VASECTOMY Bilateral 04/04/2017   [facility]   WISDOM TOOTH EXTRACTION      Social History   Socioeconomic History   Marital status: Married    Spouse name: Not on file   Number of children: Not on file   Years of education: Not on file   Highest education level: Some college, no degree  Occupational History   Not on file  Tobacco Use   Smoking status: Never   Smokeless tobacco: Never  Vaping Use   Vaping status: Never Used  Substance and Sexual Activity   Alcohol use: Yes    Alcohol/week: 0.0 - 1.0 standard drinks of alcohol    Comment: beer occ   Drug use: No    Sexual activity: Yes  Other Topics Concern   Not on file  Social History Narrative   Not on file   Social Drivers of Health   Financial Resource Strain: High Risk (11/13/2023)   Overall Financial Resource Strain (CARDIA)    Difficulty of Paying Living Expenses: Hard  Food Insecurity: No Food Insecurity (11/13/2023)   Hunger Vital Sign    Worried About Running Out of Food in the Last Year: Never true    Ran Out of Food in the Last Year: Never true  Transportation Needs: No Transportation Needs (11/13/2023)   PRAPARE - Administrator, Civil Service (Medical): No    Lack of Transportation (Non-Medical): No  Physical Activity: Unknown (11/13/2023)   Exercise Vital Sign    Days of Exercise per Week: 0 days    Minutes of Exercise per Session: Not on file  Stress: No Stress Concern Present (11/13/2023)   [employer] of Occupational Health - Occupational Stress Questionnaire    Feeling of Stress : Not at all  Social Connections: Moderately Isolated (11/13/2023)   Social Connection and Isolation Panel [NHANES]    Frequency of Communication with Friends and Family: Never    Frequency of Social Gatherings with Friends and Family: Never    Attends Religious  Services: More than 4 times per year    Active Member of Clubs or Organizations: No    Attends Engineer, Structural: Not on file    Marital Status: Married    Family History  Problem Relation Age of Onset   Diabetes Father    Hypertension Father     Health Maintenance  Topic Date Due   COVID-19 Vaccine (1) Never done   HIV Screening  Never done   DTaP/Tdap/Td (1 - Tdap) Never done   INFLUENZA VACCINE  Never done   Hepatitis C Screening  Completed   HPV VACCINES  Aged Out     ----------------------------------------------------------------------------------------------------------------------------------------------------------------------------------------------------------------- Physical Exam BP 132/88  (BP Location: Left Arm, Patient Position: Sitting, Cuff Size: Normal)   Pulse 87   Ht 5' 7 (1.702 m)   Wt 182 lb (82.6 kg)   SpO2 96%   BMI 28.51 kg/m   Physical Exam Constitutional:      Appearance: Normal appearance.  Eyes:     General: No scleral icterus. Cardiovascular:     Rate and Rhythm: Normal rate and regular rhythm.  Pulmonary:     Effort: Pulmonary effort is normal.     Breath sounds: Normal breath sounds.  Musculoskeletal:     Cervical back: Neck supple.  Neurological:     Mental Status: He is alert.  Psychiatric:        Mood and Affect: Mood normal.        Behavior: Behavior normal.     ------------------------------------------------------------------------------------------------------------------------------------------------------------------------------------------------------------------- Assessment and Plan  Post-viral cough syndrome Recommend continued supportive care.  Adding flonase , tessalon  and steroid burst.  Stay well hydrated.  Humidifier at home may be helpful as well.    Meds ordered this encounter  Medications   benzonatate  (TESSALON ) 200 MG capsule    Sig: Take 1 capsule (200 mg total) by mouth 2 (two) times daily as needed for cough.    Dispense:  30 capsule    Refill:  1   fluticasone  (FLONASE ) 50 MCG/ACT nasal spray    Sig: Place 2 sprays into both nostrils daily.    Dispense:  16 g    Refill:  6   predniSONE  (DELTASONE ) 50 MG tablet    Sig: Take 1 tab po daily x5 days.    Dispense:  5 tablet    Refill:  0    No follow-ups on file.    This visit occurred during the SARS-CoV-2 public health emergency.  Safety protocols were in place, including screening questions prior to the visit, additional usage of staff PPE, and extensive cleaning of exam room while observing appropriate contact time as indicated for disinfecting solutions.

## 2024-04-03 ENCOUNTER — Telehealth: Payer: Self-pay

## 2024-04-03 ENCOUNTER — Ambulatory Visit (INDEPENDENT_AMBULATORY_CARE_PROVIDER_SITE_OTHER): Payer: Self-pay | Admitting: Family Medicine

## 2024-04-03 ENCOUNTER — Encounter: Payer: Self-pay | Admitting: Family Medicine

## 2024-04-03 VITALS — BP 136/89 | HR 64 | Ht 67.0 in | Wt 186.0 lb

## 2024-04-03 DIAGNOSIS — R7309 Other abnormal glucose: Secondary | ICD-10-CM

## 2024-04-03 DIAGNOSIS — E119 Type 2 diabetes mellitus without complications: Secondary | ICD-10-CM | POA: Insufficient documentation

## 2024-04-03 LAB — POCT GLYCOSYLATED HEMOGLOBIN (HGB A1C): Hemoglobin A1C: 8.8 % — AB (ref 4.0–5.6)

## 2024-04-03 MED ORDER — TIRZEPATIDE 5 MG/0.5ML ~~LOC~~ SOAJ: 5.0000 mg | SUBCUTANEOUS | 0 refills | Status: DC

## 2024-04-03 MED ORDER — METFORMIN HCL ER 500 MG PO TB24: 500.0000 mg | ORAL_TABLET | Freq: Every day | ORAL | 1 refills | Status: DC

## 2024-04-03 NOTE — Telephone Encounter (Signed)
 Copied from CRM [id number]. Topic: Clinical - Prescription Issue >> Apr 03, 2024  9:22 AM Julie Oddi wrote: Reason for CRM: Madelyne Schiff from [facility] called in regarding tirzepatide (MOUNJARO) 5 MG/0.5ML Pen. They are asking for diagnosis code for prescription. Please contact at [phone].

## 2024-04-03 NOTE — Assessment & Plan Note (Signed)
 Discussed treatment and management of diabetes.  He is currently uninsured but plans to obtain insurance.  Starting metformin and given 1 month sample box of Mounjaro 2.5mg .  Encouraged continued dietary changes and incorporation of exercise.  Updated labs today including urine microalbumin.

## 2024-04-03 NOTE — Patient Instructions (Signed)

## 2024-04-03 NOTE — Telephone Encounter (Signed)
 Gave pharmacy ICD-10 E11.9 for diabetes.

## 2024-04-03 NOTE — Progress Notes (Signed)
 Roger Marquez - 38 y.o. male MRN [id number]  Date of birth: 04-22-1986  Subjective Chief Complaint  Patient presents with   Medical Management of Chronic Issues    HPI Roger Marquez is a 38 y.o. 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  No known prior history of diabetes. A1c at DOT physical was 9.6% about 1.5 months ago.  He has not had symptoms including increased thirst, urination, or fatigue.  He has started working on diet since his DOT physical and A1c improved some to 8.8%.  He is trying to walk some.    ROS:  A comprehensive ROS was completed and negative except as noted per HPI  No Known Allergies  Past Medical History:  Diagnosis Date   Complication of anesthesia    Hematuria, microscopic    occ ,followed by urologist   Inguinal hernia    right   PONV (postoperative nausea and vomiting)     Past Surgical History:  Procedure Laterality Date   HERNIA REPAIR  03/27/11   right   INGUINAL HERNIA REPAIR Bilateral 01/04/2015   Procedure: LAPAROSCOPIC BILATERAL INGUINAL HERNIA REPAIR WITH MESH;  Surgeon: Sim Dryer, MD;  Location: [facility] OR;  Service: General;  Laterality: Bilateral;   INSERTION OF MESH Bilateral 01/04/2015   Procedure: INSERTION OF MESH;  Surgeon: Sim Dryer, MD;  Location: [facility] OR;  Service: General;  Laterality: Bilateral;   LEG SURGERY     bilateral   VASECTOMY Bilateral 04/04/2017   [facility]   WISDOM TOOTH EXTRACTION      Social History   Socioeconomic History   Marital status: Married    Spouse name: Not on file   Number of children: Not on file   Years of education: Not on file   Highest education level: Some college, no degree  Occupational History   Not on file  Tobacco Use   Smoking status: Never   Smokeless tobacco: Never  Vaping Use   Vaping status: Never Used  Substance and Sexual Activity   Alcohol use: Yes    Alcohol/week: 0.0 - 1.0 standard drinks of alcohol    Comment: beer occ   Drug use: No    Sexual activity: Yes  Other Topics Concern   Not on file  Social History Narrative   Not on file   Social Drivers of Health   Financial Resource Strain: High Risk (11/13/2023)   Overall Financial Resource Strain (CARDIA)    Difficulty of Paying Living Expenses: Hard  Food Insecurity: No Food Insecurity (11/13/2023)   Hunger Vital Sign    Worried About Running Out of Food in the Last Year: Never true    Ran Out of Food in the Last Year: Never true  Transportation Needs: No Transportation Needs (11/13/2023)   PRAPARE - Administrator, Civil Service (Medical): No    Lack of Transportation (Non-Medical): No  Physical Activity: Unknown (11/13/2023)   Exercise Vital Sign    Days of Exercise per Week: 0 days    Minutes of Exercise per Session: Not on file  Stress: No Stress Concern Present (11/13/2023)   [employer] of Occupational Health - Occupational Stress Questionnaire    Feeling of Stress : Not at all  Social Connections: Moderately Isolated (11/13/2023)   Social Connection and Isolation Panel [NHANES]    Frequency of Communication with Friends and Family: Never    Frequency of Social Gatherings with Friends and Family: Never    Attends  Religious Services: More than 4 times per year    Active Member of Clubs or Organizations: No    Attends Engineer, structural: Not on file    Marital Status: Married    Family History  Problem Relation Age of Onset   Diabetes Father    Hypertension Father     Health Maintenance  Topic Date Due   COVID-19 Vaccine (1) Never done   FOOT EXAM  Never done   OPHTHALMOLOGY EXAM  Never done   HIV Screening  Never done   Diabetic kidney evaluation - Urine ACR  Never done   DTaP/Tdap/Td (1 - Tdap) Never done   Diabetic kidney evaluation - eGFR measurement  12/06/2021   INFLUENZA VACCINE  06/05/2024   HEMOGLOBIN A1C  10/04/2024   Hepatitis C Screening  Completed   Pneumococcal Vaccine 52-35 Years old  Aged Out   HPV VACCINES   Aged Out   Meningococcal B Vaccine  Aged Out     ----------------------------------------------------------------------------------------------------------------------------------------------------------------------------------------------------------------- Physical Exam BP 136/89   Pulse 64   Ht 5\' 7"  (1.702 m)   Wt 186 lb (84.4 kg)   SpO2 97%   BMI 29.13 kg/m   Physical Exam Constitutional:      Appearance: Normal appearance.  HENT:     Head: Normocephalic and atraumatic.  Cardiovascular:     Rate and Rhythm: Normal rate and regular rhythm.  Pulmonary:     Effort: Pulmonary effort is normal.     Breath sounds: Normal breath sounds.  Neurological:     General: No focal deficit present.     Mental Status: He is alert.  Psychiatric:        Mood and Affect: Mood normal.        Behavior: Behavior normal.     ------------------------------------------------------------------------------------------------------------------------------------------------------------------------------------------------------------------- Assessment and Plan  Type 2 diabetes mellitus without complication, without long-term current use of insulin (HCC) Discussed treatment and management of diabetes.  He is currently uninsured but plans to obtain insurance.  Starting metformin and given 1 month sample box of Mounjaro 2.5mg .  Encouraged continued dietary changes and incorporation of exercise.  Updated labs today including urine microalbumin.    Meds ordered this encounter  Medications   metFORMIN (GLUCOPHAGE-XR) 500 MG 24 hr tablet    Sig: Take 1 tablet (500 mg total) by mouth daily with breakfast.    Dispense:  90 tablet    Refill:  1   tirzepatide (MOUNJARO) 5 MG/0.5ML Pen    Sig: Inject 5 mg into the skin once a week. Start after completion of 2.5mg  sample box.    Dispense:  6 mL    Refill:  0    Return in about 3 months (around 07/04/2024) for Type 2 Diabetes.

## 2024-04-04 LAB — CMP14+EGFR
ALT: 89 IU/L — ABNORMAL HIGH (ref 0–44)
AST: 49 IU/L — ABNORMAL HIGH (ref 0–40)
Albumin: 4.4 g/dL (ref 4.1–5.1)
Alkaline Phosphatase: 47 IU/L (ref 44–121)
BUN/Creatinine Ratio: 16 (ref 9–20)
BUN: 14 mg/dL (ref 6–20)
Bilirubin Total: 0.4 mg/dL (ref 0.0–1.2)
CO2: 22 mmol/L (ref 20–29)
Calcium: 9.2 mg/dL (ref 8.7–10.2)
Chloride: 99 mmol/L (ref 96–106)
Creatinine, Ser: 0.89 mg/dL (ref 0.76–1.27)
Globulin, Total: 2.4 g/dL (ref 1.5–4.5)
Glucose: 167 mg/dL — ABNORMAL HIGH (ref 70–99)
Potassium: 4.7 mmol/L (ref 3.5–5.2)
Sodium: 140 mmol/L (ref 134–144)
Total Protein: 6.8 g/dL (ref 6.0–8.5)
eGFR: 112 mL/min/{1.73_m2} (ref >59)

## 2024-04-04 LAB — CBC WITH DIFFERENTIAL/PLATELET
Basophils Absolute: 0.1 10*3/uL (ref 0.0–0.2)
Basos: 1 %
EOS (ABSOLUTE): 0.3 10*3/uL (ref 0.0–0.4)
Eos: 4 %
Hematocrit: 46.5 % (ref 37.5–51.0)
Hemoglobin: 15.4 g/dL (ref 13.0–17.7)
Immature Grans (Abs): 0 10*3/uL (ref 0.0–0.1)
Immature Granulocytes: 0 %
Lymphocytes Absolute: 2.6 10*3/uL (ref 0.7–3.1)
Lymphs: 38 %
MCH: 30.1 pg (ref 26.6–33.0)
MCHC: 33.1 g/dL (ref 31.5–35.7)
MCV: 91 fL (ref 79–97)
Monocytes Absolute: 0.6 10*3/uL (ref 0.1–0.9)
Monocytes: 8 %
Neutrophils Absolute: 3.4 10*3/uL (ref 1.4–7.0)
Neutrophils: 49 %
Platelets: 264 10*3/uL (ref 150–450)
RBC: 5.11 x10E6/uL (ref 4.14–5.80)
RDW: 12.8 % (ref 11.6–15.4)
WBC: 6.9 10*3/uL (ref 3.4–10.8)

## 2024-04-04 LAB — LIPID PANEL WITH LDL/HDL RATIO
Cholesterol, Total: 218 mg/dL — ABNORMAL HIGH (ref 100–199)
HDL: 42 mg/dL (ref >39)
LDL Chol Calc (NIH): 140 mg/dL — ABNORMAL HIGH (ref 0–99)
LDL/HDL Ratio: 3.3 ratio (ref 0.0–3.6)
Triglycerides: 202 mg/dL — ABNORMAL HIGH (ref 0–149)
VLDL Cholesterol Cal: 36 mg/dL (ref 5–40)

## 2024-04-04 LAB — MICROALBUMIN / CREATININE URINE RATIO
Creatinine, Urine: 155.7 mg/dL
Microalb/Creat Ratio: 18 mg/g{creat} (ref 0–29)
Microalbumin, Urine: 28.6 ug/mL

## 2024-04-15 ENCOUNTER — Ambulatory Visit: Payer: Self-pay | Admitting: Medical-Surgical

## 2024-05-06 ENCOUNTER — Telehealth: Payer: Self-pay

## 2024-05-06 NOTE — Telephone Encounter (Signed)
 Copied from CRM [id number]. Topic: Clinical - Prescription Issue >> May 06, 2024  1:57 PM Kevelyn M wrote: Reason for CRM: Patient has been taking tirzepatide  (MOUNJARO ) 5 MG/0.5ML Pen but recently ran out last Saturday is missing 2 doses. The patient has been self pay, but now has insurance but needs a prior authorization. He has to wait for his insurance card to arrive, patient's wife is requesting a sample until everything gets sorted out, especially because of the holiday weekend coming up. Call back # [phone] or [phone].

## 2024-05-07 NOTE — Telephone Encounter (Signed)
 Copied from CRM [id number]. Topic: Clinical - Prescription Issue >> May 06, 2024  1:57 PM Kevelyn M wrote: Reason for CRM: Patient has been taking tirzepatide  (MOUNJARO ) 5 MG/0.5ML Pen but recently ran out last Saturday is missing 2 doses. The patient has been self pay, but now has insurance but needs a prior authorization. He has to wait for his insurance card to arrive, patient's wife is requesting a sample until everything gets sorted out, especially because of the holiday weekend coming up. Call back # [phone] or [phone]. >> May 07, 2024  4:34 PM Brittney F wrote: Patient's wife, called back and was informed that the patient's PCP did relay that there are some sample available; Patient's wife now would like to confirm if she can come pick up those samples.; Agent contact [facility] to confirm if this was possible due to the time of day; PCP did relay that he doesn't feel the sample would be helpful due to patient's current dosage.

## 2024-05-11 NOTE — Telephone Encounter (Signed)
 Sample has patient's name on box. Patient is aware he will need to bring insurance information so we can do the prior authorization.

## 2024-05-19 ENCOUNTER — Telehealth: Payer: Self-pay | Admitting: Pharmacy Technician

## 2024-05-19 ENCOUNTER — Other Ambulatory Visit (HOSPITAL_COMMUNITY): Payer: Self-pay

## 2024-05-19 NOTE — Telephone Encounter (Signed)
 Pharmacy Patient Advocate Encounter  Received notification from [employer] that Prior Authorization for Mounjaro  5mg /0.63ml auto-injectors  has been APPROVED from 05/19/2024 to 05/19/2025. Unable to obtain price due to refill too soon rejection, last fill date 05/19/2024 next available fill date09/22/2025.   PA #/Case ID/Reference #: [id number]

## 2024-05-19 NOTE — Telephone Encounter (Signed)
 Pharmacy Patient Advocate Encounter   Received notification from Onbase that prior authorization for Mounjaro  5mg /0.42ml auto-injectors is required/requested.   Insurance verification completed.   The patient is insured through [employer] .   Per test claim: PA required; PA submitted to above mentioned insurance via LATENT Key/confirmation #/EOC [id number] Status is pending

## 2024-07-01 ENCOUNTER — Ambulatory Visit: Payer: Self-pay | Admitting: Family Medicine

## 2024-07-07 ENCOUNTER — Ambulatory Visit: Payer: Self-pay | Admitting: Family Medicine

## 2024-07-07 VITALS — BP 125/78 | HR 82 | Ht 67.0 in | Wt 167.0 lb

## 2024-07-07 DIAGNOSIS — E782 Mixed hyperlipidemia: Secondary | ICD-10-CM | POA: Diagnosis not present

## 2024-07-07 DIAGNOSIS — E119 Type 2 diabetes mellitus without complications: Secondary | ICD-10-CM

## 2024-07-07 DIAGNOSIS — E785 Hyperlipidemia, unspecified: Secondary | ICD-10-CM | POA: Insufficient documentation

## 2024-07-07 LAB — POCT GLYCOSYLATED HEMOGLOBIN (HGB A1C): HbA1c, POC (controlled diabetic range): 5.9 % (ref 0.0–7.0)

## 2024-07-07 NOTE — Assessment & Plan Note (Signed)
 Diabetes is much better controlled.  Will plan to continue metformin  and mounjaro  at current strength.  F/u in 3-4 months.

## 2024-07-07 NOTE — Assessment & Plan Note (Signed)
 Will plan to recheck fasting lipids at next visit.

## 2024-07-07 NOTE — Progress Notes (Signed)
 Roger Marquez - 38 y.o. male MRN [id number]  Date of birth: Sep 06, 1986  Subjective Chief Complaint  Patient presents with   Diabetes    HPI Roger Marquez is a 38 y.o. male here today for follow up visit.   He reports that he is doing well with this..  Continues on metformin  and mounjaro .  He is tolerating well at current strength.   A1c today is 5.9%.  Weight is down nearly 20lbs from last visit.   GERD stable with omeprazole .   ROS:  A comprehensive ROS was completed and negative except as noted per HPI  No Known Allergies  Past Medical History:  Diagnosis Date   Complication of anesthesia    Hematuria, microscopic    occ ,followed by urologist   Inguinal hernia    right   PONV (postoperative nausea and vomiting)     Past Surgical History:  Procedure Laterality Date   HERNIA REPAIR  03/27/11   right   INGUINAL HERNIA REPAIR Bilateral 01/04/2015   Procedure: LAPAROSCOPIC BILATERAL INGUINAL HERNIA REPAIR WITH MESH;  Surgeon: Debby Shipper, MD;  Location: [facility];  Service: General;  Laterality: Bilateral;   INSERTION OF MESH Bilateral 01/04/2015   Procedure: INSERTION OF MESH;  Surgeon: Debby Shipper, MD;  Location: [facility];  Service: General;  Laterality: Bilateral;   LEG SURGERY     bilateral   VASECTOMY Bilateral 04/04/2017   [facility]   WISDOM TOOTH EXTRACTION      Social History   Socioeconomic History   Marital status: Married    Spouse name: Not on file   Number of children: Not on file   Years of education: Not on file   Highest education level: Some college, no degree  Occupational History   Not on file  Tobacco Use   Smoking status: Never   Smokeless tobacco: Never  Vaping Use   Vaping status: Never Used  Substance and Sexual Activity   Alcohol use: Yes    Alcohol/week: 0.0 - 1.0 standard drinks of alcohol    Comment: beer occ   Drug use: No   Sexual activity: Yes  Other Topics Concern   Not on file  Social History Narrative   Not on  file   Social Drivers of Health   Financial Resource Strain: High Risk (07/07/2024)   Overall Financial Resource Strain (CARDIA)    Difficulty of Paying Living Expenses: Very hard  Food Insecurity: Food Insecurity Present (07/07/2024)   Hunger Vital Sign    Worried About Running Out of Food in the Last Year: Sometimes true    Ran Out of Food in the Last Year: Never true  Transportation Needs: No Transportation Needs (07/07/2024)   PRAPARE - Administrator, Civil Service (Medical): No    Lack of Transportation (Non-Medical): No  Physical Activity: Inactive (07/07/2024)   Exercise Vital Sign    Days of Exercise per Week: 0 days    Minutes of Exercise per Session: Not on file  Stress: Stress Concern Present (07/07/2024)   [employer] of Occupational Health - Occupational Stress Questionnaire    Feeling of Stress: To some extent  Social Connections: Moderately Isolated (07/07/2024)   Social Connection and Isolation Panel    Frequency of Communication with Friends and Family: Never    Frequency of Social Gatherings with Friends and Family: Never    Attends Religious Services: More than 4 times per year    Active Member of Clubs or Organizations: No  Attends Banker Meetings: Not on file    Marital Status: Married    Family History  Problem Relation Age of Onset   Diabetes Father    Hypertension Father     Health Maintenance  Topic Date Due   COVID-19 Vaccine (1) Never done   OPHTHALMOLOGY EXAM  Never done   HIV Screening  Never done   DTaP/Tdap/Td (1 - Tdap) Never done   Pneumococcal Vaccine (1 of 2 - PCV) Never done   Hepatitis B Vaccines 19-59 Average Risk (1 of 3 - 19+ 3-dose series) Never done   HPV VACCINES (1 - Risk 3-dose SCDM series) Never done   INFLUENZA VACCINE  02/02/2025 (Originally 06/05/2024)   HEMOGLOBIN A1C  01/04/2025   Diabetic kidney evaluation - eGFR measurement  04/03/2025   Diabetic kidney evaluation - Urine ACR  04/03/2025   FOOT  EXAM  07/07/2025   Hepatitis C Screening  Completed   Meningococcal B Vaccine  Aged Out     ----------------------------------------------------------------------------------------------------------------------------------------------------------------------------------------------------------------- Physical Exam There were no vitals taken for this visit.  Physical Exam Constitutional:      Appearance: Normal appearance.  HENT:     Head: Normocephalic and atraumatic.  Cardiovascular:     Rate and Rhythm: Normal rate and regular rhythm.  Pulmonary:     Effort: Pulmonary effort is normal.     Breath sounds: Normal breath sounds.  Neurological:     General: No focal deficit present.     Mental Status: He is alert.  Psychiatric:        Mood and Affect: Mood normal.        Behavior: Behavior normal.     ------------------------------------------------------------------------------------------------------------------------------------------------------------------------------------------------------------------- Assessment and Plan  Type 2 diabetes mellitus without complication, without long-term current use of insulin (HCC) Diabetes is much better controlled.  Will plan to continue metformin  and mounjaro  at current strength.  F/u in 3-4 months.   HLD (hyperlipidemia) Will plan to recheck fasting lipids at next visit.    No orders of the defined types were placed in this encounter.   Return in about 4 months (around 11/06/2024) for Type 2 Diabetes.

## 2024-07-20 ENCOUNTER — Telehealth: Payer: Self-pay

## 2024-07-20 NOTE — Telephone Encounter (Signed)
 Copied from CRM [id number]. 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 Please follow up with status of paperwork.

## 2024-07-23 NOTE — Telephone Encounter (Signed)
 Patient informed of what representative had told me at time of call.

## 2024-07-23 NOTE — Telephone Encounter (Signed)
 [facility] [place] [phone] Called and spoke with representative  She feels that pateint misunderstood what he was told  Was told that (no form is needed )will just need A1C within the last 90 days before re certification  at the 6 month follow up  (Patient has received a 6 month card ) When comes back to re certify  will need an  updated A1C  at that time  sent to them

## 2024-07-28 ENCOUNTER — Telehealth: Payer: Self-pay

## 2024-07-28 NOTE — Telephone Encounter (Signed)
 Patient came into office to drop off Non Insulin Treated Diabetes Mellitus Assessment Form to be completed by PCP, forms placed in Dr.Matthews box, thanks.

## 2024-08-05 ENCOUNTER — Other Ambulatory Visit: Payer: Self-pay | Admitting: Family Medicine

## 2024-08-06 ENCOUNTER — Telehealth: Payer: Self-pay

## 2024-08-06 NOTE — Telephone Encounter (Signed)
 Spoke with patient - states that Dr. Alvia is no longer her PCP- she will reach out to the new PCP to have them  address this.

## 2024-08-06 NOTE — Telephone Encounter (Signed)
 Copied from CRM #[id number]. Topic: General - Other >> Aug 06, 2024 11:33 AM Roger Marquez wrote: Reason for CRM: Patient calling back to check the status of Non Insulin Treated Diabetes Mellitus Assessment Form, patient is wanting to know when it is going to be completed.

## 2024-08-06 NOTE — Telephone Encounter (Signed)
 Error message as below- disregard- incorrect chart -

## 2024-08-07 NOTE — Telephone Encounter (Signed)
 Patient came into office to get Assessment Form, thanks.

## 2024-09-25 ENCOUNTER — Other Ambulatory Visit: Payer: Self-pay | Admitting: Family Medicine

## 2024-09-25 DIAGNOSIS — E119 Type 2 diabetes mellitus without complications: Secondary | ICD-10-CM

## 2024-10-23 ENCOUNTER — Other Ambulatory Visit: Payer: Self-pay | Admitting: Family Medicine

## 2024-11-06 ENCOUNTER — Encounter: Payer: Self-pay | Admitting: Family Medicine

## 2024-11-06 ENCOUNTER — Ambulatory Visit: Admitting: Family Medicine

## 2024-11-06 VITALS — BP 120/68 | HR 73 | Ht 67.0 in | Wt 171.0 lb

## 2024-11-06 DIAGNOSIS — K219 Gastro-esophageal reflux disease without esophagitis: Secondary | ICD-10-CM | POA: Diagnosis not present

## 2024-11-06 DIAGNOSIS — E119 Type 2 diabetes mellitus without complications: Secondary | ICD-10-CM | POA: Diagnosis not present

## 2024-11-06 LAB — POCT GLYCOSYLATED HEMOGLOBIN (HGB A1C): HbA1c, POC (controlled diabetic range): 5.7 % (ref 0.0–7.0)

## 2024-11-06 MED ORDER — TIRZEPATIDE 7.5 MG/0.5ML ~~LOC~~ SOAJ: 7.5000 mg | SUBCUTANEOUS | 1 refills | Status: AC

## 2024-11-06 NOTE — Assessment & Plan Note (Signed)
 Stable with omeprazole .  Continue at current strength.

## 2024-11-06 NOTE — Assessment & Plan Note (Addendum)
 Diabetes remains well controlled.  Will plan to stop metformin  and increase mounjaro  to 7.5mg .  F/u in 3-4 months

## 2024-11-06 NOTE — Progress Notes (Signed)
 " Roger Marquez - 39 y.o. male MRN [id number]  Date of birth: 1986/05/04  Subjective Chief Complaint  Patient presents with   Diabetes    HPI Roger Marquez is a 39 y.o. male here today for follow up visit.   Diabetes is currently managed with mounjaro  and metformin .  He is tolerating this well overall.  Reports feeling cold at times.  Weight has been fairly stable.  His A1c today is 5.7%.   He continues to work on dietary changes.   GERD is stable with omeprazole .   ROS:  A comprehensive ROS was completed and negative except as noted per HPI  Allergies[1]  Past Medical History:  Diagnosis Date   Complication of anesthesia    Hematuria, microscopic    occ ,followed by urologist   Inguinal hernia    right   PONV (postoperative nausea and vomiting)     Past Surgical History:  Procedure Laterality Date   HERNIA REPAIR  03/27/11   right   INGUINAL HERNIA REPAIR Bilateral 01/04/2015   Procedure: LAPAROSCOPIC BILATERAL INGUINAL HERNIA REPAIR WITH MESH;  Surgeon: Debby Shipper, MD;  Location: [facility];  Service: General;  Laterality: Bilateral;   INSERTION OF MESH Bilateral 01/04/2015   Procedure: INSERTION OF MESH;  Surgeon: Debby Shipper, MD;  Location: [facility];  Service: General;  Laterality: Bilateral;   LEG SURGERY     bilateral   VASECTOMY Bilateral 04/04/2017   [facility]   WISDOM TOOTH EXTRACTION      Social History   Socioeconomic History   Marital status: Married    Spouse name: Not on file   Number of children: Not on file   Years of education: Not on file   Highest education level: Some college, no degree  Occupational History   Not on file  Tobacco Use   Smoking status: Never   Smokeless tobacco: Never  Vaping Use   Vaping status: Never Used  Substance and Sexual Activity   Alcohol use: Yes    Alcohol/week: 0.0 - 1.0 standard drinks of alcohol    Comment: beer occ   Drug use: No   Sexual activity: Yes  Other Topics Concern   Not on file   Social History Narrative   Not on file   Social Drivers of Health   Tobacco Use: Low Risk (11/06/2024)   Patient History    Smoking Tobacco Use: Never    Smokeless Tobacco Use: Never    Passive Exposure: Not on file  Financial Resource Strain: High Risk (11/03/2024)   Overall Financial Resource Strain (CARDIA)    Difficulty of Paying Living Expenses: Very hard  Food Insecurity: Food Insecurity Present (11/03/2024)   Epic    Worried About Programme Researcher, Broadcasting/film/video in the Last Year: Sometimes true    Ran Out of Food in the Last Year: Never true  Transportation Needs: No Transportation Needs (11/03/2024)   Epic    Lack of Transportation (Medical): No    Lack of Transportation (Non-Medical): No  Physical Activity: Inactive (11/03/2024)   Exercise Vital Sign    Days of Exercise per Week: 0 days    Minutes of Exercise per Session: Not on file  Stress: Stress Concern Present (11/03/2024)   [employer] of Occupational Health - Occupational Stress Questionnaire    Feeling of Stress: To some extent  Social Connections: Moderately Isolated (11/03/2024)   Social Connection and Isolation Panel    Frequency of Communication with Friends and Family: Never  Frequency of Social Gatherings with Friends and Family: Never    Attends Religious Services: More than 4 times per year    Active Member of Clubs or Organizations: No    Attends Banker Meetings: Not on file    Marital Status: Married  Depression (PHQ2-9): Low Risk (07/07/2024)   Depression (PHQ2-9)    PHQ-2 Score: 0  Alcohol Screen: Low Risk (11/03/2024)   Alcohol Screen    Last Alcohol Screening Score (AUDIT): 2  Housing: Low Risk (11/03/2024)   Epic    Unable to Pay for Housing in the Last Year: No    Number of Times Moved in the Last Year: 0    Homeless in the Last Year: No  Utilities: Not on file  Health Literacy: Not on file    Family History  Problem Relation Age of Onset   Diabetes Father    Hypertension  Father     Health Maintenance  Topic Date Due   OPHTHALMOLOGY EXAM  Never done   COVID-19 Vaccine (1) 11/22/2024 (Originally 06/24/1986)   Influenza Vaccine  02/02/2025 (Originally 06/05/2024)   DTaP/Tdap/Td (1 - Tdap) 11/06/2025 (Originally 12/25/2004)   Pneumococcal Vaccine (1 of 2 - PCV) 11/06/2025 (Originally 12/25/2004)   Hepatitis B Vaccines 19-59 Average Risk (1 of 3 - 19+ 3-dose series) 11/06/2025 (Originally 12/25/2004)   HPV VACCINES (1 - Risk 3-dose SCDM series) 11/06/2025 (Originally 12/25/2012)   HIV Screening  11/06/2025 (Originally 12/25/2000)   Diabetic kidney evaluation - eGFR measurement  04/03/2025   Diabetic kidney evaluation - Urine ACR  04/03/2025   HEMOGLOBIN A1C  05/06/2025   FOOT EXAM  07/07/2025   Hepatitis C Screening  Completed   Meningococcal B Vaccine  Aged Out     ----------------------------------------------------------------------------------------------------------------------------------------------------------------------------------------------------------------- Physical Exam BP 120/68 (BP Location: Left Arm, Patient Position: Sitting, Cuff Size: Normal)   Pulse 73   Ht 5' 7 (1.702 m)   Wt 171 lb (77.6 kg)   SpO2 99%   BMI 26.78 kg/m   Physical Exam Constitutional:      Appearance: Normal appearance.  Eyes:     General: No scleral icterus. Cardiovascular:     Rate and Rhythm: Normal rate and regular rhythm.  Pulmonary:     Effort: Pulmonary effort is normal.     Breath sounds: Normal breath sounds.  Musculoskeletal:     Cervical back: Neck supple.  Neurological:     Mental Status: He is alert.  Psychiatric:        Mood and Affect: Mood normal.        Behavior: Behavior normal.     ------------------------------------------------------------------------------------------------------------------------------------------------------------------------------------------------------------------- Assessment and Plan  Type 2 diabetes  mellitus without complication, without long-term current use of insulin (HCC) Diabetes remains well controlled.  Will plan to stop metformin  and increase mounjaro  to 7.5mg .  F/u in 3-4 months  GERD (gastroesophageal reflux disease) Stable with omeprazole .  Continue at current strength.     Meds ordered this encounter  Medications   tirzepatide  (MOUNJARO ) 7.5 MG/0.5ML Pen    Sig: Inject 7.5 mg into the skin once a week.    Dispense:  6 mL    Refill:  1    Return in about 4 months (around 03/06/2025) for Type 2 Diabetes.        [1] No Known Allergies  "

## 2024-11-09 ENCOUNTER — Other Ambulatory Visit (HOSPITAL_COMMUNITY): Payer: Self-pay

## 2024-11-20 ENCOUNTER — Telehealth: Payer: Self-pay | Admitting: Family Medicine

## 2024-11-20 NOTE — Telephone Encounter (Unsigned)
 Copied from CRM [id number]. Topic: Clinical - Medication Prior Auth >> Nov 20, 2024  1:30 PM Victoria B wrote: Reason for CRM: patient called in states needs PA or increased dosage from 5 to 7.5 of mounjaro 

## 2024-11-25 ENCOUNTER — Telehealth: Payer: Self-pay

## 2024-11-25 ENCOUNTER — Other Ambulatory Visit (HOSPITAL_COMMUNITY): Payer: Self-pay

## 2024-11-25 NOTE — Telephone Encounter (Signed)
 Pharmacy Patient Advocate Encounter   Received notification from Pt Calls Messages that prior authorization for Mounjaro  7.5mg /0.8ml is required/requested.   Insurance verification completed.   The patient is insured through [employer].   Per test claim: PA required; PA submitted to above mentioned insurance via Latent Key/confirmation #/EOC [id number] Status is pending

## 2024-11-25 NOTE — Telephone Encounter (Signed)
 Pharmacy Patient Advocate Encounter  Received notification from [employer] that Prior Authorization for Mounjaro  7.5mg /0.55ml has been APPROVED from 11/25/24 to 11/25/25   PA #/Case ID/Reference #: [id number]

## 2025-03-08 ENCOUNTER — Ambulatory Visit: Admitting: Family Medicine
